# Patient Record
Sex: Male | Born: 1960 | Race: White | Hispanic: No | Marital: Married | Smoking: Never smoker
Health system: Southern US, Community
[De-identification: ages and names within clinical notes are randomized; demographics above are authoritative.]

## PROBLEM LIST (undated history)

## (undated) DIAGNOSIS — R945 Abnormal results of liver function studies: Secondary | ICD-10-CM

## (undated) DIAGNOSIS — G4700 Insomnia, unspecified: Secondary | ICD-10-CM

## (undated) DIAGNOSIS — F419 Anxiety disorder, unspecified: Secondary | ICD-10-CM

## (undated) DIAGNOSIS — N419 Inflammatory disease of prostate, unspecified: Secondary | ICD-10-CM

## (undated) DIAGNOSIS — I10 Essential (primary) hypertension: Secondary | ICD-10-CM

## (undated) DIAGNOSIS — R809 Proteinuria, unspecified: Secondary | ICD-10-CM

## (undated) DIAGNOSIS — N529 Male erectile dysfunction, unspecified: Secondary | ICD-10-CM

## (undated) DIAGNOSIS — R7989 Other specified abnormal findings of blood chemistry: Secondary | ICD-10-CM

## (undated) DIAGNOSIS — E785 Hyperlipidemia, unspecified: Secondary | ICD-10-CM

## (undated) DIAGNOSIS — K635 Polyp of colon: Secondary | ICD-10-CM

## (undated) HISTORY — PX: HEMORRHOID SURGERY: SHX153

---

## 2010-03-07 ENCOUNTER — Ambulatory Visit: Payer: Self-pay | Admitting: General Practice

## 2010-03-13 ENCOUNTER — Ambulatory Visit: Payer: Self-pay | Admitting: General Practice

## 2011-08-15 ENCOUNTER — Emergency Department: Payer: Self-pay | Admitting: Emergency Medicine

## 2011-08-15 LAB — URINALYSIS, COMPLETE
Bacteria: NONE SEEN
Bilirubin,UR: NEGATIVE
Glucose,UR: NEGATIVE mg/dL (ref 0–75)
Nitrite: NEGATIVE
Protein: NEGATIVE
RBC,UR: 1 /HPF (ref 0–5)
Specific Gravity: 1.006 (ref 1.003–1.030)
WBC UR: <1 /HPF (ref 0–5)

## 2011-08-15 LAB — CBC
MCH: 35.1 pg — ABNORMAL HIGH (ref 26.0–34.0)
MCHC: 34.8 g/dL (ref 32.0–36.0)
MCV: 101 fL — ABNORMAL HIGH (ref 80–100)
Platelet: 188 10*3/uL (ref 150–440)
RBC: 4.65 10*6/uL (ref 4.40–5.90)

## 2011-08-15 LAB — COMPREHENSIVE METABOLIC PANEL
Alkaline Phosphatase: 94 U/L (ref 50–136)
Calcium, Total: 8.7 mg/dL (ref 8.5–10.1)
Co2: 25 mmol/L (ref 21–32)
EGFR (African American): >60
EGFR (Non-African Amer.): >60
Osmolality: 277 (ref 275–301)
SGPT (ALT): 93 U/L — ABNORMAL HIGH
Sodium: 139 mmol/L (ref 136–145)

## 2011-08-21 LAB — CULTURE, BLOOD (SINGLE)

## 2012-09-02 ENCOUNTER — Ambulatory Visit: Payer: Self-pay | Admitting: Internal Medicine

## 2012-11-12 ENCOUNTER — Ambulatory Visit: Payer: Self-pay | Admitting: Internal Medicine

## 2013-08-23 HISTORY — PX: COLONOSCOPY: SHX174

## 2018-11-05 ENCOUNTER — Other Ambulatory Visit: Payer: Self-pay

## 2018-11-05 ENCOUNTER — Emergency Department
Admission: EM | Admit: 2018-11-05 | Discharge: 2018-11-05 | Disposition: A | Discharge status: Home / Self Care | Payer: PRIVATE HEALTH INSURANCE | Attending: Emergency Medicine | Admitting: Emergency Medicine

## 2018-11-05 ENCOUNTER — Encounter: Payer: Self-pay | Admitting: Emergency Medicine

## 2018-11-05 DIAGNOSIS — Y92512 Supermarket, store or market as the place of occurrence of the external cause: Secondary | ICD-10-CM | POA: Insufficient documentation

## 2018-11-05 DIAGNOSIS — I10 Essential (primary) hypertension: Secondary | ICD-10-CM | POA: Diagnosis not present

## 2018-11-05 DIAGNOSIS — Y9301 Activity, walking, marching and hiking: Secondary | ICD-10-CM | POA: Insufficient documentation

## 2018-11-05 DIAGNOSIS — S0181XA Laceration without foreign body of other part of head, initial encounter: Secondary | ICD-10-CM

## 2018-11-05 DIAGNOSIS — W01198A Fall on same level from slipping, tripping and stumbling with subsequent striking against other object, initial encounter: Secondary | ICD-10-CM | POA: Diagnosis not present

## 2018-11-05 DIAGNOSIS — Y998 Other external cause status: Secondary | ICD-10-CM | POA: Insufficient documentation

## 2018-11-05 DIAGNOSIS — S0990XA Unspecified injury of head, initial encounter: Secondary | ICD-10-CM

## 2018-11-05 HISTORY — DX: Essential (primary) hypertension: I10

## 2018-11-05 NOTE — ED Provider Notes (Signed)
[facility] Emergency Department Provider Note  ____________________________________________   I have reviewed the triage vital signs and the nursing notes.   HISTORY  Chief Complaint Fall and Laceration   History limited by: Not Limited   HPI Ryan Rivers is a 58 y.o. male who presents to the emergency department today via EMS after suffering a fall and forehead laceration.  The patient states that he was in a grocery store.  He turned around quickly and slipped on the floor.  Try to catch himself with his left hand but then hit his forehead on the ground.  Started bleeding profusely.  He denies any loss of consciousness.  Has some soreness in his left shoulder but no significant pain.  Patient denies any blood thinners.  Denies any blurry vision.  Denies any nausea or vomiting.   Records reviewed. Per medical record review patient has a history of hypertension.  Past Medical History:  Diagnosis Date  . Hypertension     There are no active problems to display for this patient.   History reviewed. No pertinent surgical history.  Prior to Admission medications   Not on File    Allergies Patient has no known allergies.  History reviewed. No pertinent family history.  Social History Social History   Tobacco Use  . Smoking status: Never Smoker  . Smokeless tobacco: Never Used  Substance Use Topics  . Alcohol use: Yes    Comment: couple beers a night  . Drug use: Never    Review of Systems Constitutional: No fever/chills Eyes: No visual changes. ENT: No sore throat. Cardiovascular: Denies chest pain. Respiratory: Denies shortness of breath. Gastrointestinal: No abdominal pain.  No nausea, no vomiting.  No diarrhea.   Genitourinary: Negative for dysuria. Musculoskeletal: Positive for left shoulder pain.  Skin: Positive for laceration to forehead.  Neurological: Negative for headaches, focal weakness or  numbness.  ____________________________________________   PHYSICAL EXAM:  VITAL SIGNS: ED Triage Vitals [11/05/18 1656]  Enc Vitals Group     BP (!) 184/101     Pulse Rate 98     Resp 18     Temp 98.4 F (36.9 C)     Temp Source Oral     SpO2 100 %     Weight 150 lb (68 kg)     Height 5\' 8"  (1.727 m)     Head Circumference      Peak Flow      Pain Score 0     Pain Loc      Pain Edu?      Excl. in [place]?      Constitutional: Alert and oriented.  Eyes: Conjunctivae are normal.  ENT      Head: Normocephalic      Nose: No congestion/rhinnorhea.      Mouth/Throat: Mucous membranes are moist.      Neck: No stridor. Cardiovascular: Normal rate, regular rhythm.  No murmurs, rubs, or gallops.  Respiratory: Normal respiratory effort without tachypnea nor retractions. Breath sounds are clear and equal bilaterally. No wheezes/rales/rhonchi. Gastrointestinal: Soft and non tender. No rebound. No guarding.  Genitourinary: Deferred Musculoskeletal: Left shoulder without deformity. Full ROM. Neurologic:  Normal speech and language. No gross focal neurologic deficits are appreciated.  Skin: Roughly two 1-cm lacerations to left forehead.  Psychiatric: Mood and affect are normal. Speech and behavior are normal. Patient exhibits appropriate insight and judgment.  ____________________________________________    LABS (pertinent positives/negatives)  None  ____________________________________________   EKG  None  ____________________________________________    RADIOLOGY  None  ____________________________________________   PROCEDURES  Procedures  LACERATION REPAIR Performed by: Nance Pear Authorized by: Nance Pear Consent: Verbal consent obtained. Risks and benefits: risks, benefits and alternatives were discussed Consent given by: patient Patient identity confirmed: provided demographic data Prepped and Draped in normal sterile fashion Wound  explored  Laceration Location: left forehead  Laceration Length: two 1 cm lacerations  No Foreign Bodies seen or palpated  Anesthesia: local infiltration  Local anesthetic: lidocaine with epinephrine  Anesthetic total: 2 ml  Irrigation method: syringe Amount of cleaning: standard  Skin closure: 5-0 vicryl rapide  Number of sutures: 5  Technique: simple interrupted  Patient tolerance: Patient tolerated the procedure well with no immediate complications.  ____________________________________________   INITIAL IMPRESSION / ASSESSMENT AND PLAN / ED COURSE  Pertinent labs & imaging results that were available during my care of the patient were reviewed by me and considered in my medical decision making (see chart for details).   Patient presented to the emergency department today after a fall.  Patient suffered two 1 cm lacerations to his left forehead.  No loss of consciousness.  Patient is not on any blood thinners.  Lacerations were repaired.  Full and painless ROM of the left shoulder. Doubt osseous injury or dislocation. Discussed with patient return precautions. ____________________________________________   FINAL CLINICAL IMPRESSION(S) / ED DIAGNOSES  Final diagnoses:  Injury of head, initial encounter  Laceration of forehead, initial encounter     Note: This dictation was prepared with Dragon dictation. Any transcriptional errors that result from this process are unintentional     Nance Pear, MD 11/05/18 2012

## 2018-11-05 NOTE — ED Triage Notes (Signed)
Pt to ED via EMS after fall today, states was at grocery store and slipped on the floor and fell, hitting head on floor, denies LOC or pain.  Pt ambulatory on scene, heavy bleeding at store but controlled with EMS.  EMS states seems arterial.  Presents A&Ox4, chest rise even and unlabored, in NAD at this time.

## 2018-11-05 NOTE — Discharge Instructions (Addendum)
Please seek medical attention for any high fevers, chest pain, shortness of breath, change in behavior, persistent vomiting, bloody stool or any other new or concerning symptoms.  

## 2018-11-24 ENCOUNTER — Other Ambulatory Visit: Payer: Self-pay

## 2018-11-24 ENCOUNTER — Other Ambulatory Visit (HOSPITAL_COMMUNITY): Payer: Self-pay | Admitting: Physician Assistant

## 2018-11-24 ENCOUNTER — Ambulatory Visit
Admission: RE | Admit: 2018-11-24 | Discharge: 2018-11-24 | Disposition: A | Discharge status: Home / Self Care | Payer: PRIVATE HEALTH INSURANCE | Source: Ambulatory Visit | Attending: Physician Assistant | Admitting: Physician Assistant

## 2018-11-24 ENCOUNTER — Other Ambulatory Visit: Payer: Self-pay | Admitting: Physician Assistant

## 2018-11-24 DIAGNOSIS — G44311 Acute post-traumatic headache, intractable: Secondary | ICD-10-CM | POA: Insufficient documentation

## 2018-11-24 DIAGNOSIS — S0990XD Unspecified injury of head, subsequent encounter: Secondary | ICD-10-CM | POA: Diagnosis not present

## 2018-11-24 DIAGNOSIS — R42 Dizziness and giddiness: Secondary | ICD-10-CM | POA: Insufficient documentation

## 2019-02-11 ENCOUNTER — Other Ambulatory Visit
Admission: RE | Admit: 2019-02-11 | Discharge: 2019-02-11 | Disposition: A | Discharge status: Home / Self Care | Payer: PRIVATE HEALTH INSURANCE | Source: Ambulatory Visit | Attending: Internal Medicine | Admitting: Internal Medicine

## 2019-02-11 ENCOUNTER — Other Ambulatory Visit: Payer: Self-pay

## 2019-02-11 DIAGNOSIS — Z86010 Personal history of colonic polyps: Secondary | ICD-10-CM | POA: Diagnosis not present

## 2019-02-11 DIAGNOSIS — Z20828 Contact with and (suspected) exposure to other viral communicable diseases: Secondary | ICD-10-CM | POA: Diagnosis not present

## 2019-02-11 DIAGNOSIS — Z01812 Encounter for preprocedural laboratory examination: Secondary | ICD-10-CM | POA: Diagnosis not present

## 2019-02-11 LAB — SARS CORONAVIRUS 2 (TAT 6-24 HRS): SARS Coronavirus 2: NEGATIVE

## 2019-02-15 ENCOUNTER — Encounter: Payer: Self-pay | Admitting: *Deleted

## 2019-02-16 ENCOUNTER — Ambulatory Visit: Payer: PRIVATE HEALTH INSURANCE | Admitting: Certified Registered Nurse Anesthetist

## 2019-02-16 ENCOUNTER — Encounter: Payer: Self-pay | Admitting: Emergency Medicine

## 2019-02-16 ENCOUNTER — Ambulatory Visit
Admission: RE | Admit: 2019-02-16 | Discharge: 2019-02-16 | Disposition: A | Discharge status: Home / Self Care | Payer: PRIVATE HEALTH INSURANCE | Attending: Internal Medicine | Admitting: Internal Medicine

## 2019-02-16 ENCOUNTER — Encounter
Admission: RE | Disposition: A | Discharge status: Home / Self Care | Payer: Self-pay | Source: Home / Self Care | Attending: Internal Medicine

## 2019-02-16 DIAGNOSIS — K621 Rectal polyp: Secondary | ICD-10-CM | POA: Diagnosis not present

## 2019-02-16 DIAGNOSIS — K640 First degree hemorrhoids: Secondary | ICD-10-CM | POA: Diagnosis not present

## 2019-02-16 DIAGNOSIS — I10 Essential (primary) hypertension: Secondary | ICD-10-CM | POA: Diagnosis not present

## 2019-02-16 DIAGNOSIS — K5730 Diverticulosis of large intestine without perforation or abscess without bleeding: Secondary | ICD-10-CM | POA: Diagnosis not present

## 2019-02-16 DIAGNOSIS — Z1211 Encounter for screening for malignant neoplasm of colon: Secondary | ICD-10-CM | POA: Diagnosis not present

## 2019-02-16 DIAGNOSIS — Z86010 Personal history of colonic polyps: Secondary | ICD-10-CM | POA: Insufficient documentation

## 2019-02-16 HISTORY — DX: Abnormal results of liver function studies: R94.5

## 2019-02-16 HISTORY — PX: COLONOSCOPY WITH PROPOFOL: SHX5780

## 2019-02-16 HISTORY — DX: Male erectile dysfunction, unspecified: N52.9

## 2019-02-16 HISTORY — DX: Other specified abnormal findings of blood chemistry: R79.89

## 2019-02-16 HISTORY — DX: Anxiety disorder, unspecified: F41.9

## 2019-02-16 HISTORY — DX: Polyp of colon: K63.5

## 2019-02-16 HISTORY — DX: Insomnia, unspecified: G47.00

## 2019-02-16 HISTORY — DX: Inflammatory disease of prostate, unspecified: N41.9

## 2019-02-16 HISTORY — DX: Proteinuria, unspecified: R80.9

## 2019-02-16 HISTORY — DX: Hyperlipidemia, unspecified: E78.5

## 2019-02-16 SURGERY — COLONOSCOPY WITH PROPOFOL
Anesthesia: General

## 2019-02-16 MED ORDER — MIDAZOLAM HCL 2 MG/2ML IJ SOLN
INTRAMUSCULAR | Status: AC
  Filled 2019-02-16: qty 2

## 2019-02-16 MED ORDER — PROPOFOL 10 MG/ML IV BOLUS
INTRAVENOUS | Status: AC
  Filled 2019-02-16: qty 40

## 2019-02-16 MED ORDER — PROPOFOL 10 MG/ML IV BOLUS
INTRAVENOUS | Status: DC | PRN
  Administered 2019-02-16: 50 mg via INTRAVENOUS
  Administered 2019-02-16 (×2): 40 mg via INTRAVENOUS
  Administered 2019-02-16 (×2): 50 mg via INTRAVENOUS
  Administered 2019-02-16: 100 mg via INTRAVENOUS

## 2019-02-16 MED ORDER — MIDAZOLAM HCL 2 MG/2ML IJ SOLN
INTRAMUSCULAR | Status: DC | PRN
  Administered 2019-02-16: 2 mg via INTRAVENOUS

## 2019-02-16 MED ORDER — PROPOFOL 500 MG/50ML IV EMUL
INTRAVENOUS | Status: DC | PRN
  Administered 2019-02-16: 120 ug/kg/min via INTRAVENOUS

## 2019-02-16 MED ORDER — SODIUM CHLORIDE 0.9 % IV SOLN
INTRAVENOUS | Status: DC
  Administered 2019-02-16: 1000 mL via INTRAVENOUS

## 2019-02-16 NOTE — Op Note (Signed)
[facility] Gastroenterology Patient Name: Ryan Rivers Procedure Date: 02/16/2019 10:35 AM MRN: [id number] Account #: [id number] Date of Birth: [birth year] Admit Type: Outpatient Age: 58 Room: [facility] ENDO ROOM 3 Gender: Male Note Status: Finalized Procedure:            Colonoscopy Indications:          Surveillance: Personal history of adenomatous polyps on                        last colonoscopy > 3 years ago Providers:            Lorie Apley K. Torre Pikus MD, MD Medicines:            Propofol per Anesthesia Complications:        No immediate complications. Procedure:            Pre-Anesthesia Assessment:                       - The risks and benefits of the procedure and the                        sedation options and risks were discussed with the                        patient. All questions were answered and informed                        consent was obtained.                       - Patient identification and proposed procedure were                        verified prior to the procedure by the nurse. The                        procedure was verified in the procedure room.                       - ASA Grade Assessment: III - A patient with severe                        systemic disease.                       - After reviewing the risks and benefits, the patient                        was deemed in satisfactory condition to undergo the                        procedure.                       After obtaining informed consent, the colonoscope was                        passed under direct vision. Throughout the procedure,                        the patient's blood pressure, pulse, and oxygen  saturations were monitored continuously. The                        Colonoscope was introduced through the anus and                        advanced to the the cecum, identified by appendiceal                        orifice and ileocecal valve. The colonoscopy was                         performed without difficulty. The patient tolerated the                        procedure well. The quality of the bowel preparation                        was good. The ileocecal valve, appendiceal orifice, and                        rectum were photographed. Findings:      The perianal and digital rectal examinations were normal. Pertinent       negatives include normal sphincter tone and no palpable rectal lesions.      Multiple small and large-mouthed diverticula were found in the sigmoid       colon.      Two sessile polyps were found in the rectum. The polyps were 3 to 4 mm       in size. These polyps were removed with a jumbo cold forceps. Resection       and retrieval were complete.      Non-bleeding internal hemorrhoids were found during retroflexion. The       hemorrhoids were Grade I (internal hemorrhoids that do not prolapse).      The exam was otherwise without abnormality on direct and retroflexion       views. Impression:           - Diverticulosis in the sigmoid colon.                       - Two 3 to 4 mm polyps in the rectum, removed with a                        jumbo cold forceps. Resected and retrieved.                       - Non-bleeding internal hemorrhoids.                       - The examination was otherwise normal on direct and                        retroflexion views. Recommendation:       - Patient has a contact number available for                        emergencies. The signs and symptoms of potential                        delayed complications  were discussed with the patient.                        Return to normal activities tomorrow. Written discharge                        instructions were provided to the patient.                       - Resume previous diet.                       - Continue present medications.                       - Await pathology results.                       - Repeat colonoscopy in 5 years for  surveillance.                       - Return to physician assistant in 3 months.                       - The findings and recommendations were discussed with                        the patient. Procedure Code(s):    --- Professional ---                       [phone], Colonoscopy, flexible; with biopsy, single or                        multiple Diagnosis Code(s):    --- Professional ---                       K57.30, Diverticulosis of large intestine without                        perforation or abscess without bleeding                       K62.1, Rectal polyp                       K64.0, First degree hemorrhoids                       Z86.010, Personal history of colonic polyps CPT copyright 2019 [employer]. All rights reserved. The codes documented in this report are preliminary and upon coder review may  be revised to meet current compliance requirements. Efrain Sella MD, MD 02/16/2019 11:20:06 AM This report has been signed electronically. Number of Addenda: 0 Note Initiated On: 02/16/2019 10:35 AM Scope Withdrawal Time: 0 hours 6 minutes 16 seconds  Total Procedure Duration: 0 hours 10 minutes 21 seconds  Estimated Blood Loss: Estimated blood loss: none.      [facility]

## 2019-02-16 NOTE — Anesthesia Postprocedure Evaluation (Signed)
Anesthesia Post Note  Patient: Ryan Rivers  Procedure(s) Performed: COLONOSCOPY WITH PROPOFOL (N/A )  Patient location during evaluation: Endoscopy Anesthesia Type: General Level of consciousness: awake and alert and oriented Pain management: pain level controlled Vital Signs Assessment: post-procedure vital signs reviewed and stable Respiratory status: spontaneous breathing, nonlabored ventilation and respiratory function stable Cardiovascular status: blood pressure returned to baseline and stable Postop Assessment: no signs of nausea or vomiting Anesthetic complications: no     Last Vitals:  Vitals:   02/16/19 0931 02/16/19 1120  BP: (!) 171/97   Pulse: 95   Resp: 17   Temp: (!) 36 C (!) 36 C  SpO2: 99%     Last Pain:  Vitals:   02/16/19 1150  TempSrc:   PainSc: 0-No pain                 Gustavo Meditz

## 2019-02-16 NOTE — H&P (Signed)
Outpatient short stay form Pre-procedure 02/16/2019 9:51 AM Teodoro K. Alice Reichert, M.D.  Primary Physician:John Edwina Barth, M.D.  Reason for visit:  Personal hx of adenomatous colon polyps  History of present illness:                            Patient presents for colonoscopy for a personal hx of colon polyps. The patient denies abdominal pain, abnormal weight loss or rectal bleeding.     Current Facility-Administered Medications:  .  0.9 %  sodium chloride infusion, , Intravenous, Continuous, Henderson, Benay Pike, MD, Last Rate: 20 mL/hr at 02/16/19 0945, 1,000 mL at 02/16/19 0945  Medications Prior to Admission  Medication Sig Dispense Refill Last Dose  . Nebivolol HCl (BYSTOLIC PO) Take 20 mg by mouth daily.   02/15/2019 at Unknown time     Allergies  Allergen Reactions  . Sulfa Antibiotics Other (See Comments)  . Sulfur Other (See Comments)     Past Medical History:  Diagnosis Date  . Abnormal LFTs (liver function tests)   . Anxiety   . Colon polyps   . Erectile dysfunction   . Hyperlipidemia   . Hypertension   . Insomnia   . Microalbuminuria   . Prostatitis     Review of systems:  Otherwise negative.    Physical Exam  Gen: Alert, oriented. Appears stated age.  HEENT: [place]/AT. PERRLA. Lungs: CTA, no wheezes. CV: RR nl S1, S2. Abd: soft, benign, no masses. BS+ Ext: No edema. Pulses 2+    Planned procedures: Proceed with colonoscopy. The patient understands the nature of the planned procedure, indications, risks, alternatives and potential complications including but not limited to bleeding, infection, perforation, damage to internal organs and possible oversedation/side effects from anesthesia. The patient agrees and gives consent to proceed.  Please refer to procedure notes for findings, recommendations and patient disposition/instructions.     Teodoro K. Alice Reichert, M.D. Gastroenterology 02/16/2019  9:51 AM

## 2019-02-16 NOTE — Transfer of Care (Signed)
Immediate Anesthesia Transfer of Care Note  Patient: Ryan Rivers  Procedure(s) Performed: COLONOSCOPY WITH PROPOFOL (N/A )  Patient Location: PACU and Endoscopy Unit  Anesthesia Type:General  Level of Consciousness: awake and patient cooperative  Airway & Oxygen Therapy: Patient Spontanous Breathing  Post-op Assessment: Report given to RN, Post -op Vital signs reviewed and stable and Patient moving all extremities  Post vital signs: Reviewed and stable  Last Vitals:  Vitals Value Taken Time  BP 106/74 02/16/19 1120  Temp    Pulse 90 02/16/19 1120  Resp 18 02/16/19 1120  SpO2 97 % 02/16/19 1120  Vitals shown include unvalidated device data.  Last Pain:  Vitals:   02/16/19 0931  TempSrc: Tympanic  PainSc: 0-No pain         Complications: No apparent anesthesia complications

## 2019-02-16 NOTE — Anesthesia Preprocedure Evaluation (Signed)
Anesthesia Evaluation  Patient identified by MRN, date of birth, ID band Patient awake    Reviewed: Allergy & Precautions, H&P , NPO status , Patient's Chart, lab work & pertinent test results, reviewed documented beta blocker date and time   Airway Mallampati: II   Neck ROM: full    Dental  (+) Poor Dentition   Pulmonary neg pulmonary ROS,    Pulmonary exam normal        Cardiovascular Exercise Tolerance: Good hypertension, On Medications negative cardio ROS Normal cardiovascular exam Rhythm:regular Rate:Normal     Neuro/Psych Anxiety negative neurological ROS  negative psych ROS   GI/Hepatic negative GI ROS, Neg liver ROS,   Endo/Other  negative endocrine ROS  Renal/GU negative Renal ROS  negative genitourinary   Musculoskeletal   Abdominal   Peds  Hematology negative hematology ROS (+)   Anesthesia Other Findings Past Medical History: No date: Abnormal LFTs (liver function tests) No date: Anxiety No date: Colon polyps No date: Erectile dysfunction No date: Hyperlipidemia No date: Hypertension No date: Insomnia No date: Microalbuminuria No date: Prostatitis Past Surgical History: 08/23/2013: COLONOSCOPY No date: HEMORRHOID SURGERY BMI    Body Mass Index: 23.57 kg/m     Reproductive/Obstetrics negative OB ROS                             Anesthesia Physical Anesthesia Plan  ASA: II  Anesthesia Plan: General   Post-op Pain Management:    Induction:   PONV Risk Score and Plan:   Airway Management Planned:   Additional Equipment:   Intra-op Plan:   Post-operative Plan:   Informed Consent: I have reviewed the patients History and Physical, chart, labs and discussed the procedure including the risks, benefits and alternatives for the proposed anesthesia with the patient or authorized representative who has indicated his/her understanding and acceptance.      Dental Advisory Given  Plan Discussed with: CRNA  Anesthesia Plan Comments:         Anesthesia Quick Evaluation

## 2019-02-16 NOTE — Interval H&P Note (Signed)
History and Physical Interval Note:  02/16/2019 9:52 AM  Ryan Rivers  has presented today for surgery, with the diagnosis of PERSONAL HX.OF COLON POLYPS.  The various methods of treatment have been discussed with the patient and family. After consideration of risks, benefits and other options for treatment, the patient has consented to  Procedure(s): COLONOSCOPY WITH PROPOFOL (N/A) as a surgical intervention.  The patient's history has been reviewed, patient examined, no change in status, stable for surgery.  I have reviewed the patient's chart and labs.  Questions were answered to the patient's satisfaction.     Crandall, Byron

## 2019-02-16 NOTE — Anesthesia Post-op Follow-up Note (Signed)
Anesthesia QCDR form completed.        

## 2019-02-17 ENCOUNTER — Encounter: Payer: Self-pay | Admitting: Internal Medicine

## 2019-02-17 LAB — SURGICAL PATHOLOGY

## 2020-02-09 IMAGING — CT CT HEAD WITHOUT CONTRAST
3 series · 16 of 47 positions shown, 19 images · non-contrast
Comparison: None.

CLINICAL DATA: Lightheadedness since a fall and blow to the head 2
weeks ago. Initial encounter.

EXAM:
CT HEAD WITHOUT CONTRAST
TECHNIQUE: Contiguous axial images were obtained from the base of the skull
through the vertex without intravenous contrast.

[Series 2: head wo · axial · 0.42mm/px · z∈[+349,+484]mm · 10 of 33 slices shown, 13 images]
[im 3/33  brain]
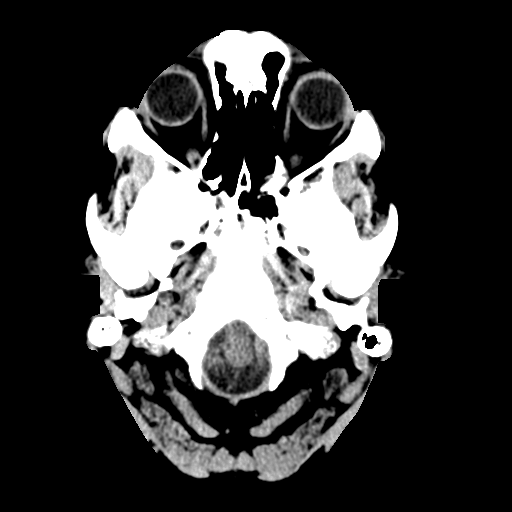
[im 3/33  bone]
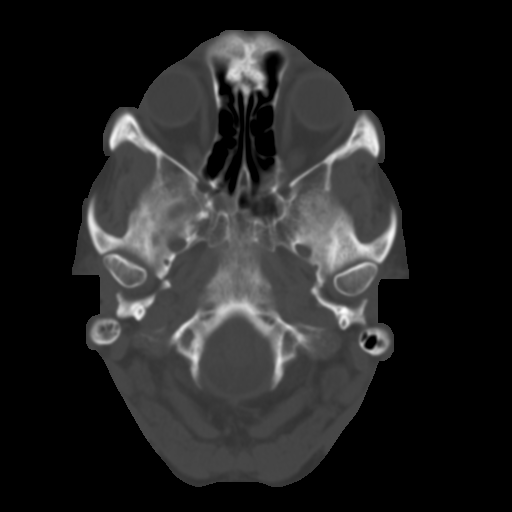
[im 6/33  brain]
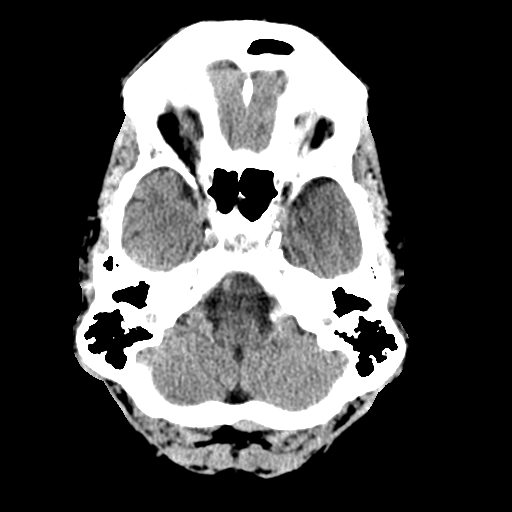
[im 9/33  brain]
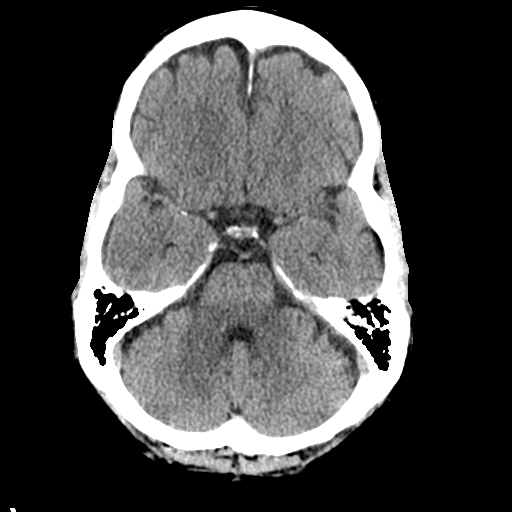
[im 12/33  brain]
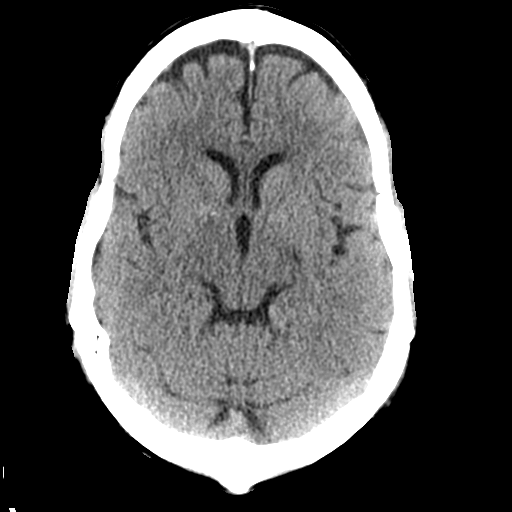
[im 15/33  brain]
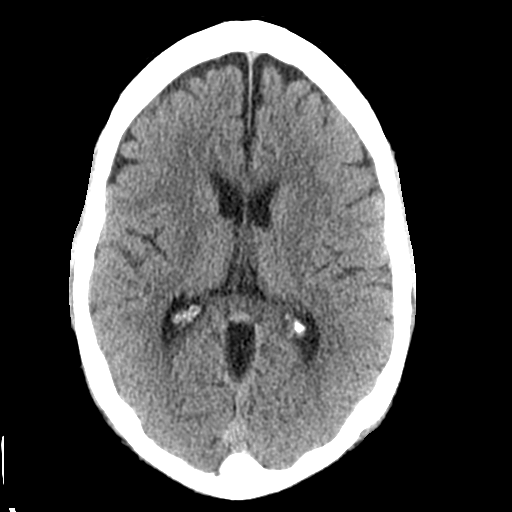
[im 15/33  bone]
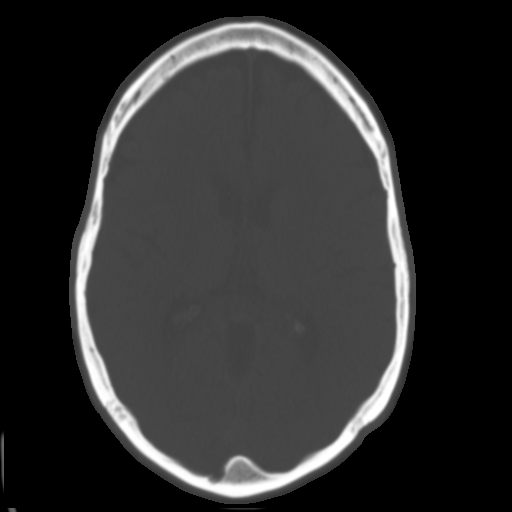
[im 18/33  brain]
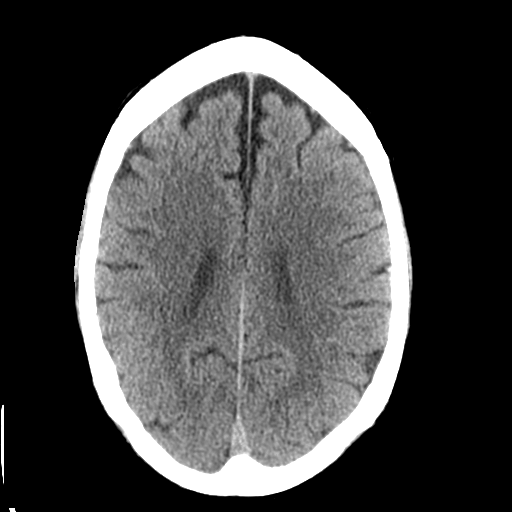
[im 21/33  brain]
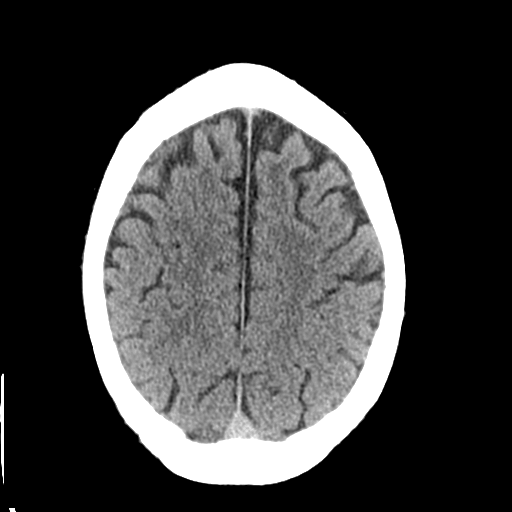
[im 25/33  brain]
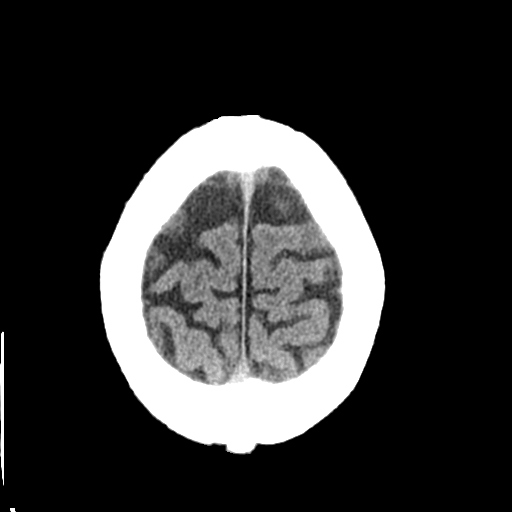
[im 27/33  brain]
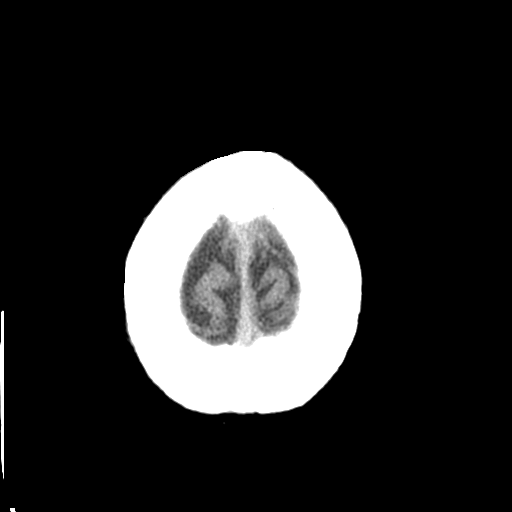
[im 27/33  bone]
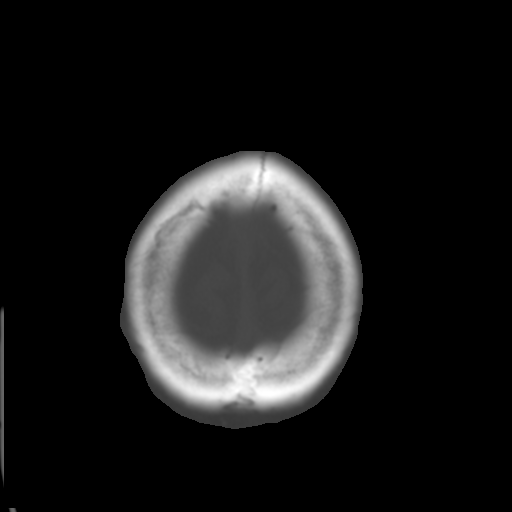
[im 30/33  brain]
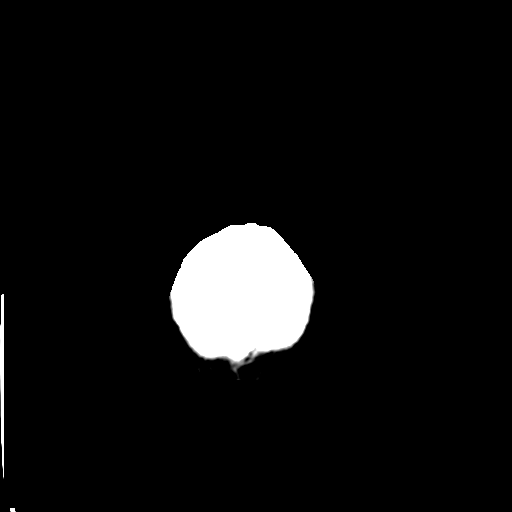

[Series 4: coronal soft tissue · coronal · 0.33mm/px · 3 of 72 slices shown]
[im 24/72  brain]
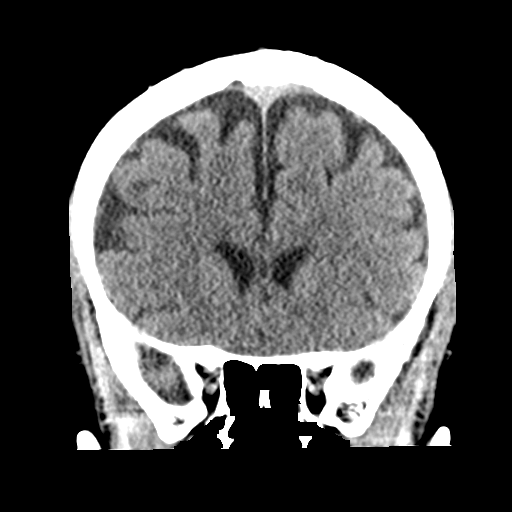
[im 32/72  brain]
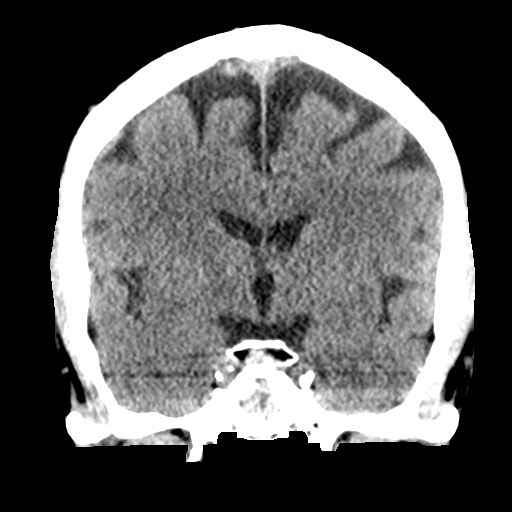
[im 40/72  brain]
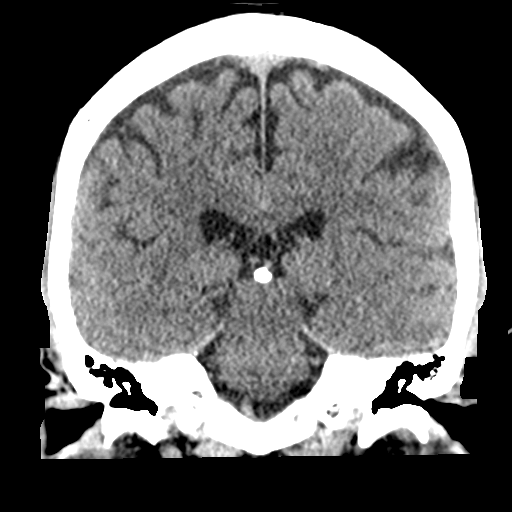

[Series 5: sagittal soft tissue · sagittal · 0.33mm/px · 3 of 59 slices shown]
[im 20/59  brain]
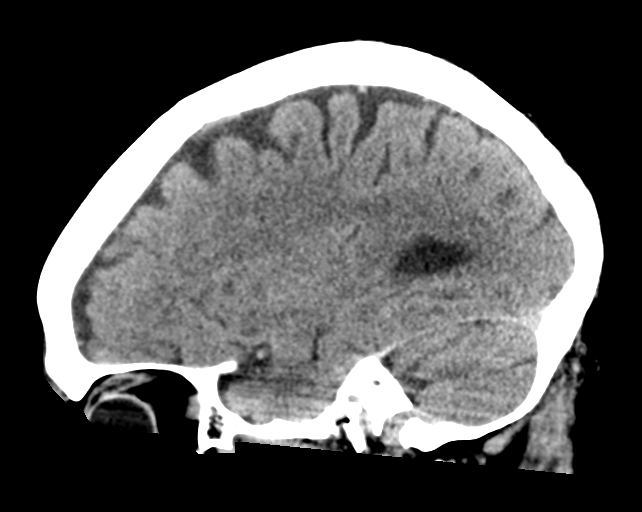
[im 30/59  brain]
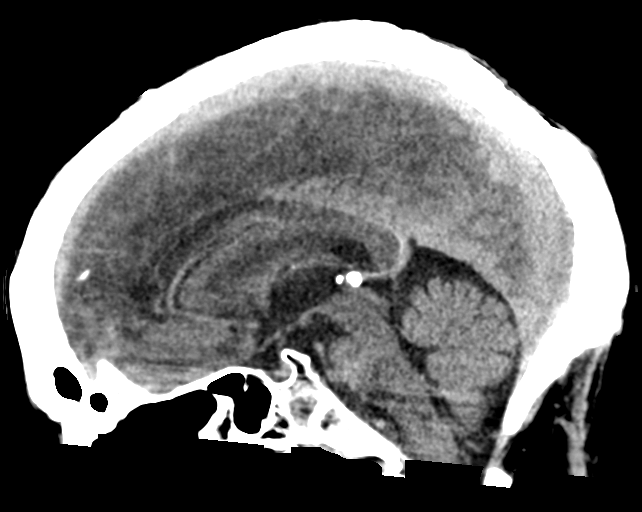
[im 39/59  brain]
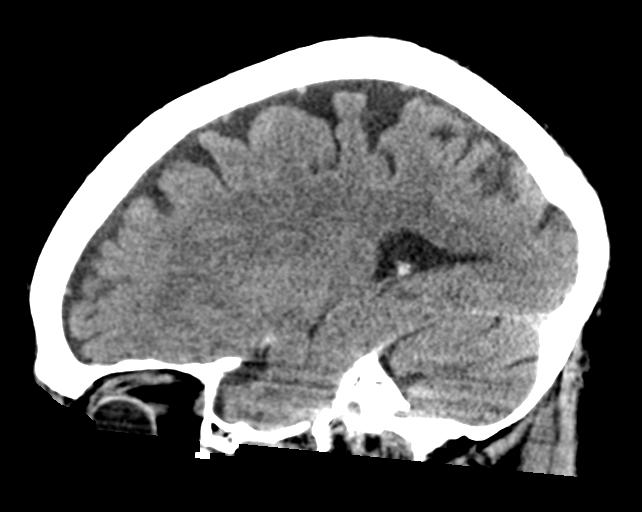

[16 of 47 positions shown; findings below may reference images not displayed]

FINDINGS: Brain: No evidence of acute infarction, hemorrhage, hydrocephalus,
extra-axial collection or mass lesion/mass effect. Mild atrophy
noted.

Vascular: No hyperdense vessel or unexpected calcification.

Skull: Normal. Negative for fracture or focal lesion.

Sinuses/Orbits: Small mucous retention cyst or polyp right sphenoid
sinus is seen. Otherwise negative.

Other: None.
IMPRESSION: No acute abnormality.

Mild cortical atrophy.

## 2020-03-07 ENCOUNTER — Encounter: Payer: PRIVATE HEALTH INSURANCE | Admitting: Dermatology

## 2020-05-28 ENCOUNTER — Other Ambulatory Visit: Payer: Self-pay

## 2020-05-28 ENCOUNTER — Ambulatory Visit (INDEPENDENT_AMBULATORY_CARE_PROVIDER_SITE_OTHER): Payer: 59 | Admitting: Dermatology

## 2020-05-28 DIAGNOSIS — L821 Other seborrheic keratosis: Secondary | ICD-10-CM

## 2020-05-28 DIAGNOSIS — Z1283 Encounter for screening for malignant neoplasm of skin: Secondary | ICD-10-CM | POA: Diagnosis not present

## 2020-05-28 DIAGNOSIS — L814 Other melanin hyperpigmentation: Secondary | ICD-10-CM

## 2020-05-28 DIAGNOSIS — L304 Erythema intertrigo: Secondary | ICD-10-CM

## 2020-05-28 DIAGNOSIS — D229 Melanocytic nevi, unspecified: Secondary | ICD-10-CM

## 2020-05-28 DIAGNOSIS — L578 Other skin changes due to chronic exposure to nonionizing radiation: Secondary | ICD-10-CM

## 2020-05-28 DIAGNOSIS — D1800 Hemangioma unspecified site: Secondary | ICD-10-CM

## 2020-05-28 NOTE — Progress Notes (Signed)
   Follow-Up Visit   Subjective  Ryan Rivers is a 59 y.o. male who presents for the following: tbse (Patient here today for TBSE, patient states he had mole removed on left tear duct 4 months ago and area seems to be doing good. Also has a place on left lower leg that would like checked. States first noticed 6 month ago denies any symptoms. Patient states he has a place left bottom buttock on mole he would like checked. ). The patient presents for Total-Body Skin Exam (TBSE) for skin cancer screening and mole check.  The following portions of the chart were reviewed this encounter and updated as appropriate:   Tobacco  Allergies  Meds  Problems  Med Hx  Surg Hx  Fam Hx     Review of Systems:  No other skin or systemic complaints except as noted in HPI or Assessment and Plan.  Objective  Well appearing patient in no apparent distress; mood and affect are within normal limits.  A full examination was performed including scalp, head, eyes, ears, nose, lips, neck, chest, axillae, abdomen, back, buttocks, bilateral upper extremities, bilateral lower extremities, hands, feet, fingers, toes, fingernails, and toenails. All findings within normal limits unless otherwise noted below.  Objective  bilateral inbetween toes: Erythema and macerations.    Assessment & Plan  Erythema intertrigo bilateral inbetween toes [facility] for intertrigo  use daily as needed for use in between toes.   Recommend zeasorb af powder between toes for maintenance.    Intertrigo is a chronic recurrent rash that occurs in skin fold areas that may be associated with friction; heat; moisture; yeast; fungus; and bacteria.  It is exacerbated by increased movement / activity; sweating; and higher atmospheric temperature.  Lentigines - Scattered tan macules - Discussed due to sun exposure - Benign, observe - Call for any changes  Seborrheic Keratoses - Stuck-on, waxy, tan-brown papules and plaques  -  Discussed benign etiology and prognosis. - Observe - Call for any changes  Melanocytic Nevi - Tan-brown and/or pink-flesh-colored symmetric macules and papules - Benign appearing on exam today - Observation - Call clinic for new or changing moles - Recommend daily use of broad spectrum spf 30+ sunscreen to sun-exposed areas.   Hemangiomas - Red papules - Discussed benign nature - Observe - Call for any changes  Actinic Damage - Chronic, secondary to cumulative UV/sun exposure - diffuse scaly erythematous macules with underlying dyspigmentation - Recommend daily broad spectrum sunscreen SPF 30+ to sun-exposed areas, reapply every 2 hours as needed.  - Call for new or changing lesions.  Skin cancer screening performed today.  Return in about 1 year (around 05/28/2021) for TBSE.   IRuthell Rummage, CMA, am acting as scribe for Sarina Ser, MD.  Documentation: I have reviewed the above documentation for accuracy and completeness, and I agree with the above.  Sarina Ser, MD

## 2020-05-28 NOTE — Patient Instructions (Signed)
Instructions for [facility] Medications  One or more of your medications was sent to the [facility] mail order compounding pharmacy. You will receive an email from them and can purchase the medicine through that link. It will then be mailed to your home at the address you confirmed. If for any reason you do not receive an email from them, please check your spam folder. If you still do not find the email, please let us know. [facility] phone number is [phone].  Recommend zeasorb af powder between toes for maintenance.

## 2020-05-30 ENCOUNTER — Encounter: Payer: Self-pay | Admitting: Dermatology

## 2021-05-20 ENCOUNTER — Ambulatory Visit: Payer: 59 | Admitting: Dermatology

## 2021-09-03 DIAGNOSIS — N451 Epididymitis: Secondary | ICD-10-CM | POA: Diagnosis not present

## 2021-12-06 DIAGNOSIS — R399 Unspecified symptoms and signs involving the genitourinary system: Secondary | ICD-10-CM | POA: Diagnosis not present

## 2021-12-11 DIAGNOSIS — R1030 Lower abdominal pain, unspecified: Secondary | ICD-10-CM | POA: Diagnosis not present

## 2022-02-17 DIAGNOSIS — F1210 Cannabis abuse, uncomplicated: Secondary | ICD-10-CM | POA: Diagnosis not present

## 2022-02-17 DIAGNOSIS — I10 Essential (primary) hypertension: Secondary | ICD-10-CM | POA: Diagnosis not present

## 2022-02-17 DIAGNOSIS — G4700 Insomnia, unspecified: Secondary | ICD-10-CM | POA: Diagnosis not present

## 2022-02-17 DIAGNOSIS — Z8616 Personal history of COVID-19: Secondary | ICD-10-CM | POA: Diagnosis not present

## 2022-02-17 DIAGNOSIS — R69 Illness, unspecified: Secondary | ICD-10-CM | POA: Diagnosis not present

## 2022-02-17 DIAGNOSIS — G621 Alcoholic polyneuropathy: Secondary | ICD-10-CM | POA: Diagnosis not present

## 2022-02-17 DIAGNOSIS — F1020 Alcohol dependence, uncomplicated: Secondary | ICD-10-CM | POA: Diagnosis not present

## 2022-02-28 DIAGNOSIS — Z125 Encounter for screening for malignant neoplasm of prostate: Secondary | ICD-10-CM | POA: Diagnosis not present

## 2022-02-28 DIAGNOSIS — Z0000 Encounter for general adult medical examination without abnormal findings: Secondary | ICD-10-CM | POA: Diagnosis not present

## 2022-03-03 DIAGNOSIS — I10 Essential (primary) hypertension: Secondary | ICD-10-CM | POA: Diagnosis not present

## 2022-03-03 DIAGNOSIS — R7301 Impaired fasting glucose: Secondary | ICD-10-CM | POA: Diagnosis not present

## 2022-03-03 DIAGNOSIS — R69 Illness, unspecified: Secondary | ICD-10-CM | POA: Diagnosis not present

## 2022-03-03 DIAGNOSIS — E785 Hyperlipidemia, unspecified: Secondary | ICD-10-CM | POA: Diagnosis not present

## 2022-03-03 DIAGNOSIS — F419 Anxiety disorder, unspecified: Secondary | ICD-10-CM | POA: Diagnosis not present

## 2022-03-03 DIAGNOSIS — Z0001 Encounter for general adult medical examination with abnormal findings: Secondary | ICD-10-CM | POA: Diagnosis not present

## 2022-03-12 DIAGNOSIS — M9902 Segmental and somatic dysfunction of thoracic region: Secondary | ICD-10-CM | POA: Diagnosis not present

## 2022-03-12 DIAGNOSIS — M9903 Segmental and somatic dysfunction of lumbar region: Secondary | ICD-10-CM | POA: Diagnosis not present

## 2022-03-12 DIAGNOSIS — M9904 Segmental and somatic dysfunction of sacral region: Secondary | ICD-10-CM | POA: Diagnosis not present

## 2022-03-12 DIAGNOSIS — M9906 Segmental and somatic dysfunction of lower extremity: Secondary | ICD-10-CM | POA: Diagnosis not present

## 2022-03-12 DIAGNOSIS — M9901 Segmental and somatic dysfunction of cervical region: Secondary | ICD-10-CM | POA: Diagnosis not present

## 2022-03-19 DIAGNOSIS — M9903 Segmental and somatic dysfunction of lumbar region: Secondary | ICD-10-CM | POA: Diagnosis not present

## 2022-03-19 DIAGNOSIS — M9906 Segmental and somatic dysfunction of lower extremity: Secondary | ICD-10-CM | POA: Diagnosis not present

## 2022-03-19 DIAGNOSIS — M9902 Segmental and somatic dysfunction of thoracic region: Secondary | ICD-10-CM | POA: Diagnosis not present

## 2022-03-19 DIAGNOSIS — M9901 Segmental and somatic dysfunction of cervical region: Secondary | ICD-10-CM | POA: Diagnosis not present

## 2022-03-19 DIAGNOSIS — M9904 Segmental and somatic dysfunction of sacral region: Secondary | ICD-10-CM | POA: Diagnosis not present

## 2022-04-24 DIAGNOSIS — D2262 Melanocytic nevi of left upper limb, including shoulder: Secondary | ICD-10-CM | POA: Diagnosis not present

## 2022-04-24 DIAGNOSIS — D2271 Melanocytic nevi of right lower limb, including hip: Secondary | ICD-10-CM | POA: Diagnosis not present

## 2022-04-24 DIAGNOSIS — L821 Other seborrheic keratosis: Secondary | ICD-10-CM | POA: Diagnosis not present

## 2022-04-24 DIAGNOSIS — D2272 Melanocytic nevi of left lower limb, including hip: Secondary | ICD-10-CM | POA: Diagnosis not present

## 2022-04-24 DIAGNOSIS — D225 Melanocytic nevi of trunk: Secondary | ICD-10-CM | POA: Diagnosis not present

## 2022-04-24 DIAGNOSIS — D2261 Melanocytic nevi of right upper limb, including shoulder: Secondary | ICD-10-CM | POA: Diagnosis not present

## 2022-04-24 DIAGNOSIS — L578 Other skin changes due to chronic exposure to nonionizing radiation: Secondary | ICD-10-CM | POA: Diagnosis not present

## 2022-05-21 DIAGNOSIS — R69 Illness, unspecified: Secondary | ICD-10-CM | POA: Diagnosis not present

## 2022-06-14 DIAGNOSIS — J029 Acute pharyngitis, unspecified: Secondary | ICD-10-CM | POA: Diagnosis not present

## 2022-06-14 DIAGNOSIS — Z03818 Encounter for observation for suspected exposure to other biological agents ruled out: Secondary | ICD-10-CM | POA: Diagnosis not present

## 2022-06-17 DIAGNOSIS — J039 Acute tonsillitis, unspecified: Secondary | ICD-10-CM | POA: Diagnosis not present

## 2022-06-24 DIAGNOSIS — E785 Hyperlipidemia, unspecified: Secondary | ICD-10-CM | POA: Diagnosis not present

## 2022-06-24 DIAGNOSIS — Z202 Contact with and (suspected) exposure to infections with a predominantly sexual mode of transmission: Secondary | ICD-10-CM | POA: Diagnosis not present

## 2022-06-24 DIAGNOSIS — I10 Essential (primary) hypertension: Secondary | ICD-10-CM | POA: Diagnosis not present

## 2022-06-24 DIAGNOSIS — Z125 Encounter for screening for malignant neoplasm of prostate: Secondary | ICD-10-CM | POA: Diagnosis not present

## 2022-06-24 DIAGNOSIS — R7301 Impaired fasting glucose: Secondary | ICD-10-CM | POA: Diagnosis not present

## 2022-06-24 DIAGNOSIS — R69 Illness, unspecified: Secondary | ICD-10-CM | POA: Diagnosis not present

## 2022-08-28 DIAGNOSIS — M9902 Segmental and somatic dysfunction of thoracic region: Secondary | ICD-10-CM | POA: Diagnosis not present

## 2022-08-28 DIAGNOSIS — M5413 Radiculopathy, cervicothoracic region: Secondary | ICD-10-CM | POA: Diagnosis not present

## 2022-08-28 DIAGNOSIS — M9904 Segmental and somatic dysfunction of sacral region: Secondary | ICD-10-CM | POA: Diagnosis not present

## 2022-08-28 DIAGNOSIS — M9903 Segmental and somatic dysfunction of lumbar region: Secondary | ICD-10-CM | POA: Diagnosis not present

## 2022-08-28 DIAGNOSIS — M9905 Segmental and somatic dysfunction of pelvic region: Secondary | ICD-10-CM | POA: Diagnosis not present

## 2022-08-28 DIAGNOSIS — M9901 Segmental and somatic dysfunction of cervical region: Secondary | ICD-10-CM | POA: Diagnosis not present

## 2022-09-15 DIAGNOSIS — M25432 Effusion, left wrist: Secondary | ICD-10-CM | POA: Diagnosis not present

## 2022-09-15 DIAGNOSIS — M13832 Other specified arthritis, left wrist: Secondary | ICD-10-CM | POA: Diagnosis not present

## 2022-09-15 DIAGNOSIS — M25532 Pain in left wrist: Secondary | ICD-10-CM | POA: Diagnosis not present

## 2022-09-25 DIAGNOSIS — S63592A Other specified sprain of left wrist, initial encounter: Secondary | ICD-10-CM | POA: Diagnosis not present

## 2022-10-16 DIAGNOSIS — S63592A Other specified sprain of left wrist, initial encounter: Secondary | ICD-10-CM | POA: Diagnosis not present

## 2023-02-26 DIAGNOSIS — Z0000 Encounter for general adult medical examination without abnormal findings: Secondary | ICD-10-CM | POA: Diagnosis not present

## 2023-02-26 DIAGNOSIS — Z125 Encounter for screening for malignant neoplasm of prostate: Secondary | ICD-10-CM | POA: Diagnosis not present

## 2023-04-30 DIAGNOSIS — D2262 Melanocytic nevi of left upper limb, including shoulder: Secondary | ICD-10-CM | POA: Diagnosis not present

## 2023-04-30 DIAGNOSIS — D2271 Melanocytic nevi of right lower limb, including hip: Secondary | ICD-10-CM | POA: Diagnosis not present

## 2023-04-30 DIAGNOSIS — L821 Other seborrheic keratosis: Secondary | ICD-10-CM | POA: Diagnosis not present

## 2023-04-30 DIAGNOSIS — L578 Other skin changes due to chronic exposure to nonionizing radiation: Secondary | ICD-10-CM | POA: Diagnosis not present

## 2023-04-30 DIAGNOSIS — D2272 Melanocytic nevi of left lower limb, including hip: Secondary | ICD-10-CM | POA: Diagnosis not present

## 2023-04-30 DIAGNOSIS — D225 Melanocytic nevi of trunk: Secondary | ICD-10-CM | POA: Diagnosis not present

## 2023-04-30 DIAGNOSIS — D2261 Melanocytic nevi of right upper limb, including shoulder: Secondary | ICD-10-CM | POA: Diagnosis not present

## 2023-07-09 DIAGNOSIS — M9904 Segmental and somatic dysfunction of sacral region: Secondary | ICD-10-CM | POA: Diagnosis not present

## 2023-07-09 DIAGNOSIS — M9905 Segmental and somatic dysfunction of pelvic region: Secondary | ICD-10-CM | POA: Diagnosis not present

## 2023-07-09 DIAGNOSIS — M9903 Segmental and somatic dysfunction of lumbar region: Secondary | ICD-10-CM | POA: Diagnosis not present

## 2023-07-09 DIAGNOSIS — M5413 Radiculopathy, cervicothoracic region: Secondary | ICD-10-CM | POA: Diagnosis not present

## 2023-07-09 DIAGNOSIS — M9902 Segmental and somatic dysfunction of thoracic region: Secondary | ICD-10-CM | POA: Diagnosis not present

## 2023-07-09 DIAGNOSIS — M9901 Segmental and somatic dysfunction of cervical region: Secondary | ICD-10-CM | POA: Diagnosis not present

## 2024-01-17 ENCOUNTER — Other Ambulatory Visit: Payer: Self-pay

## 2024-01-17 ENCOUNTER — Emergency Department
Admission: EM | Admit: 2024-01-17 | Discharge: 2024-01-17 | Disposition: A | Discharge status: Home / Self Care | Attending: Emergency Medicine | Admitting: Emergency Medicine

## 2024-01-17 DIAGNOSIS — I10 Essential (primary) hypertension: Secondary | ICD-10-CM | POA: Diagnosis not present

## 2024-01-17 DIAGNOSIS — R945 Abnormal results of liver function studies: Secondary | ICD-10-CM | POA: Diagnosis not present

## 2024-01-17 DIAGNOSIS — F1090 Alcohol use, unspecified, uncomplicated: Secondary | ICD-10-CM | POA: Diagnosis present

## 2024-01-17 DIAGNOSIS — Z79899 Other long term (current) drug therapy: Secondary | ICD-10-CM | POA: Insufficient documentation

## 2024-01-17 DIAGNOSIS — Y908 Blood alcohol level of 240 mg/100 ml or more: Secondary | ICD-10-CM | POA: Diagnosis not present

## 2024-01-17 DIAGNOSIS — R7989 Other specified abnormal findings of blood chemistry: Secondary | ICD-10-CM

## 2024-01-17 LAB — COMPREHENSIVE METABOLIC PANEL WITH GFR
ALT: 57 U/L — ABNORMAL HIGH (ref 0–44)
AST: 95 U/L — ABNORMAL HIGH (ref 15–41)
Albumin: 4 g/dL (ref 3.5–5.0)
Alkaline Phosphatase: 56 U/L (ref 38–126)
Anion gap: 13 (ref 5–15)
BUN: 6 mg/dL — ABNORMAL LOW (ref 8–23)
CO2: 25 mmol/L (ref 22–32)
Calcium: 8.4 mg/dL — ABNORMAL LOW (ref 8.9–10.3)
Chloride: 100 mmol/L (ref 98–111)
Creatinine, Ser: 0.72 mg/dL (ref 0.61–1.24)
GFR, Estimated: >60 mL/min (ref >60)
Glucose, Bld: 132 mg/dL — ABNORMAL HIGH (ref 70–99)
Potassium: 3.6 mmol/L (ref 3.5–5.1)
Sodium: 138 mmol/L (ref 135–145)
Total Bilirubin: 0.8 mg/dL (ref 0.0–1.2)
Total Protein: 7.6 g/dL (ref 6.5–8.1)

## 2024-01-17 LAB — URINE DRUG SCREEN, QUALITATIVE (ARMC ONLY)
Amphetamines, Ur Screen: NOT DETECTED
Barbiturates, Ur Screen: NOT DETECTED
Benzodiazepine, Ur Scrn: NOT DETECTED
Cannabinoid 50 Ng, Ur ~~LOC~~: POSITIVE — AB
Cocaine Metabolite,Ur ~~LOC~~: NOT DETECTED
MDMA (Ecstasy)Ur Screen: NOT DETECTED
Methadone Scn, Ur: NOT DETECTED
Opiate, Ur Screen: NOT DETECTED
Phencyclidine (PCP) Ur S: NOT DETECTED
Tricyclic, Ur Screen: NOT DETECTED

## 2024-01-17 LAB — CBC
HCT: 47.4 % (ref 39.0–52.0)
Hemoglobin: 16.1 g/dL (ref 13.0–17.0)
MCH: 35.2 pg — ABNORMAL HIGH (ref 26.0–34.0)
MCHC: 34 g/dL (ref 30.0–36.0)
MCV: 103.7 fL — ABNORMAL HIGH (ref 80.0–100.0)
Platelets: 127 K/uL — ABNORMAL LOW (ref 150–400)
RBC: 4.57 MIL/uL (ref 4.22–5.81)
RDW: 12 % (ref 11.5–15.5)
WBC: 4.7 K/uL (ref 4.0–10.5)
nRBC: 0 % (ref 0.0–0.2)

## 2024-01-17 LAB — ETHANOL: Alcohol, Ethyl (B): 421 mg/dL (ref <15)

## 2024-01-17 NOTE — ED Provider Notes (Signed)
 Ryan Rivers Provider Note    Event Date/Time   First MD Initiated Contact with Patient 01/17/24 1949     (approximate)   History   Alcohol  Problem   HPI  Ryan Rivers is a 63 y.o. male with history of anxiety, alcohol  use, hypertension, hyperlipidemia, presenting for alcohol  use.  Was brought in by mom and family friend because they felt that he was drinking too much.  He has not expressed any suicidal ideations or thoughts about hurting himself to him or to his mom.  He denies history of withdrawal, states he last drank alcohol  2 hours ago.  States that he drinks 6 beers in a day.  States that he is not feeling down because of issues with his girlfriend.  He denies any other symptoms, no abdominal pain, no nausea vomiting diarrhea, no chest pain or shortness of breath, no fever or weakness.  No infectious symptoms.  Independent history obtained from mom, she states that he has not expressed suicidal ideations or thoughts about hurting himself to her.  On independent review, view, he was seen as primary care doctor in October 2024, has history of hypertension, was continued on his Bystolic.     Physical Exam   Triage Vital Signs: ED Triage Vitals [01/17/24 1935]  Encounter Vitals Group     BP (!) 142/90     Girls Systolic BP Percentile      Girls Diastolic BP Percentile      Boys Systolic BP Percentile      Boys Diastolic BP Percentile      Pulse Rate 73     Resp 16     Temp 97.6 F (36.4 C)     Temp Source Oral     SpO2 98 %     Weight      Height 5' 8 (1.727 m)     Head Circumference      Peak Flow      Pain Score 0     Pain Loc      Pain Education      Exclude from Growth Chart     Most recent vital signs: Vitals:   01/17/24 1935  BP: (!) 142/90  Pulse: 73  Resp: 16  Temp: 97.6 F (36.4 C)  SpO2: 98%     General: Awake, no distress.  CV:  Good peripheral perfusion.  Resp:  Normal effort.  No tachypnea or respiratory  distress Abd:  No distention.  Soft nontender Other:  No external signs of trauma, no tongue fasciculations or tremors   ED Results / Procedures / Treatments   Labs (all labs ordered are listed, but only abnormal results are displayed) Labs Reviewed  COMPREHENSIVE METABOLIC PANEL WITH GFR - Abnormal; Notable for the following components:      Result Value   Glucose, Bld 132 (*)    BUN 6 (*)    Calcium 8.4 (*)    AST 95 (*)    ALT 57 (*)    All other components within normal limits  ETHANOL - Abnormal; Notable for the following components:   Alcohol , Ethyl (B) 421 (*)    All other components within normal limits  CBC - Abnormal; Notable for the following components:   MCV 103.7 (*)    MCH 35.2 (*)    Platelets 127 (*)    All other components within normal limits  URINE DRUG SCREEN, QUALITATIVE ([facility] ONLY) - Abnormal; Notable for the following components:  Cannabinoid 50 Ng, Ur [place] POSITIVE (*)    All other components within normal limits      PROCEDURES:  Critical Care performed: No  Procedures   MEDICATIONS ORDERED IN ED: Medications - No data to display   IMPRESSION / MDM / ASSESSMENT AND PLAN / ED COURSE  I reviewed the triage vital signs and the nursing notes.                              Differential diagnosis includes, but is not limited to, alcohol  use, electrolyte derangements, no evidence of SI.  Patient is not actively withdrawing at this time.  He is calm and appears clinically sober.  No external traumas or trauma.  Labs, ethanol level was obtained out of triage.  Patient's presentation is most consistent with acute presentation with potential threat to life or bodily function.  Independent interpretation of labs and imaging below.  Will give him something to eat.  Will plan to ambulate him.  Patient was observed in the emergency department, ambulating, tolerating p.o.  Clinically sober at this time.  He is with his friend who is able to take him home.   Did give him a number to call for [facility] in case he wanted to follow-up for alcohol  detox.  With him about lab results including incidental findings, he has a primary care doctor he can follow-up with and get repeat liver function testing.  Otherwise considered but no indication for inpatient admission at this time, he safe for outpatient management.  Will discharge with strict return precautions.  Shared decision making by patient and he is agreeable with this plan.    Clinical Course as of 01/17/24 2308  [place] Jan 17, 2024  2018 Independent review of labs, no leukocytosis, electrolytes not severely deranged, LFTs are normal, AST and ALT are mildly elevated these are improving compared to prior labs.  Suspect this might be related to his alcohol  use.  He has no jaundice, elevated bilirubin or right upper quadrant abdominal pain to suggest biliary etiology. [TT]  2048 Alcohol , Ethyl (B)(!!): 421 Elevated.  Will observe him in the emergency department pending metabolization of the alcohol . [TT]  2118 Urine Drug Screen, Qualitative(!) Positive for cannabinoids. [TT]    Clinical Course User Index [TT] Way[place], Lorelle Cummins, MD     FINAL CLINICAL IMPRESSION(S) / ED DIAGNOSES   Final diagnoses:  Alcohol  use  Elevated LFTs     Rx / DC Orders   ED Discharge Orders     None        Note:  This document was prepared using Dragon voice recognition software and may include unintentional dictation errors.    Waymond Lorelle Cummins, MD 01/17/24 [phone]

## 2024-01-17 NOTE — ED Triage Notes (Addendum)
 Pt to ed from home via POV with mother for they think I have been drinking too much. Pt admits to drinking between 6-12 beers a day. Pt states they think I need to be here. Pt is caox4, in no acute distress in triage. Pt denies SI in triage. 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

## 2024-01-17 NOTE — Discharge Instructions (Addendum)
 Please make sure to follow-up with [facility] if you are interested in alcohol  detoxification.  Patient to follow-up with your primary care doctor to get repeat liver function testing in a week.

## 2024-01-17 NOTE — ED Notes (Signed)
 Patient smells strongly of alcohol , states he has not been eating much and has been drinking way to much to deal with his emotional problems, a/o x 4

## 2024-01-17 NOTE — ED Notes (Signed)
 Patient a/o x 4, able to carry on completely normal conversation, denies SI/HI/AVH

## 2024-01-18 NOTE — Progress Notes (Signed)
 History of Present Illness:   Ryan Rivers is a 63 y.o. male here for   Verbally consented to the use of AI for note-taking.   Chief Complaint  Patient presents with  . level being to high      History of Present Illness Ryan Rivers is a 63 year old male who presents with elevated liver enzymes and alcohol  use.  He was seen in the emergency room yesterday where elevated liver enzymes were noted, with ALT elevated by about 50 points and AST by about 13 points over the normal range.  He consumes between six to eight alcoholic drinks daily, with occasional intake exceeding twelve drinks in one day.  No pain, stomach upset, or other symptoms when questioned directly. He is not currently taking medications like Tylenol  or ibuprofen that could affect liver enzymes.   Past Medical History:   Past Medical History:  Diagnosis Date  . Abnormal LFTs 2014   Had liver USS at at that time and Hepatitis testing  . Anxiety   . Colon polyps 04/2009  . ED (erectile dysfunction)   . Hx of prostatitis   . Hyperlipidemia   . Hypertension   . Insomnia ED  . Microalbuminuria 09/2011   urine MA 48 09/2011    Past Surgical History:   Past Surgical History:  Procedure Laterality Date  . COLONOSCOPY  02/16/2019   Hyperplastic colon polyp/PHx CP/Repeat 53yrs/[facility]  . COLONOSCOPY  08/23/2013, 03/20/2010   Dr. SHAUNNA. Oh @ [facility] - PHPolyps, rpt 5 yrs  . HEMORRHOIDECTOMY INTERNAL & EXTERNAL     Dr Claudene    Allergies:   Allergies  Allergen Reactions  . Diuril [Chlorothiazide] Other (See Comments)    dysuria  . Sulfur (Not Sulfa) Unknown    Unknown reaction  . Buspirone (Bulk) Other (See Comments)    Ankle swelling  . Doxazosin Other (See Comments)    Aggression  . Hydralazine Other (See Comments)    tachycardia  . Lisinopril Diarrhea  . Losartan Other (See Comments)    tremor  . Sulfa (Sulfonamide Antibiotics) Unknown    Current Medications:   Prior to Admission  medications  Medication Sig Taking? Last Dose  BYSTOLIC  20 mg tablet TAKE 1 TABLET BY MOUTH DAILY Yes Taking  tadalafiL (CIALIS) 20 MG tablet TAKE ONE TABLET BY MOUTH ONE TIME DAILY AS NEEDED FOR ERECTILE DYSFUNCTION Yes Taking    Family History:   Family History  Problem Relation Name Age of Onset  . High blood pressure (Hypertension) Father Darrell   . Coronary Artery Disease (Blocked arteries around heart) Father Darrell 68  . High blood pressure (Hypertension) Paternal Jeannine Bucks   . Glaucoma Neg Hx    . Macular degeneration Neg Hx      Social History:   Social History   Socioeconomic History  . Marital status: Divorced  . Number of children: 2  Occupational History  . 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 Smoking status: Never    Passive exposure: Never  . Smokeless tobacco: Never  Vaping Use  . Vaping status: Never Used  Substance and Sexual Activity  . Alcohol  use: Yes    Alcohol /week: 8.0 standard drinks of alcohol     Types: 8 Cans of beer per week    Comment: moderate  . Drug use: No  . Sexual activity: Not Currently    Partners: Female    Birth control/protection: None  Social History Narrative   Married, has a  daughter and son   Runs a machine shop   Tobacco none   Etoh 1-2 beers daily   Hobbies- working fishing   Social Drivers of Health   Housing Stability: Unknown (01/18/2024)   Housing Stability Vital Sign   . Homeless in the Last Year: No    Review of Systems:   A 10 point review of systems is negative, except for the pertinent positives and negatives detailed in the HPI.  Vitals:   Vitals:   01/18/24 1421  BP: 130/76  Pulse: 94  SpO2: 94%  Weight: 70.8 kg (156 lb)     Body mass index is 23.72 kg/m.  Physical Exam:   Physical Exam Vitals and nursing note reviewed.  Constitutional:      General: He is not in acute distress.    Appearance: Normal appearance. He is not ill-appearing, toxic-appearing or  diaphoretic.  HENT:     Head: Normocephalic and atraumatic.     Right Ear: External ear normal.     Left Ear: External ear normal.  Eyes:     Conjunctiva/sclera: Conjunctivae normal.  Cardiovascular:     Rate and Rhythm: Normal rate and regular rhythm.     Pulses: Normal pulses.     Heart sounds: Normal heart sounds.  Pulmonary:     Effort: Pulmonary effort is normal.     Breath sounds: Normal breath sounds.  Abdominal:     General: Bowel sounds are normal.     Palpations: There is mass.     Tenderness: There is abdominal tenderness.  Neurological:     Mental Status: He is alert.     Assessment and Plan:  No results found for this visit on 01/18/24.   Assessment & Plan Alcohol  use disorder Chronic alcohol  use disorder with consumption exceeding 12 drinks daily. Blood alcohol  level of 421 mg/dL, significantly elevated. Alcohol  use causing liver damage and potentially affecting other organs. Behavioral health intervention necessary, contingent on patient willingness. Insurance preauthorization required for behavioral health services, expected within seven days. [facility] identified for assessment and potential treatment. - Initiate prior authorization for behavioral health services. - Refer to [facility] for behavioral health assessment and potential treatment, including outpatient or inpatient care. - Ensure insurance coverage for behavioral health services. - Encourage reduction in alcohol  consumption to prevent further liver damage and other health issues. - Ethanol levels elevated at 421 in the ER yesterday. - Advised to continue some alcohol  consumption to help prevent DT  Elevated liver enzymes Elevated ALT and AST levels, likely secondary to chronic alcohol  use. No current use of Tylenol  or ibuprofen. - Order repeat liver enzyme tests in one week to monitor trends. - Advise against alcohol  consumption to allow liver enzymes to normalize. - Ensure adequate nutrition and hydration,  recommending Pedialyte if experiencing vomiting or diarrhea. - AST elevated greater than ALT.  No signs of anemia.  There was some macrocytosis on CBC from emergency room.  - Most likely to resolve with alcohol  discontinuation and improve dietary intake  Disposition: Follow-up as needed  Patient Instructions  Referral placed to [facility].  I will also like to check the liver enzymes in about a week.  Monitor for any tremors, confusion, vomiting, or seizures.  If seizure occurs please call EMS.   This note has been created using automated tools and reviewed for accuracy by provider.  Patient received an After Visit Summary    Attestation Statement:   I personally performed the service, non-incident to. [facility])  MASON MCCLELLAND MINOR, PA

## 2024-01-19 NOTE — Progress Notes (Signed)
 ENCOUNTER: Patient Class :No patient class for patient encounter Department: [facility] CLINIC [address] [place] [address]  PATIENT: Patient Demographics      Name Patient ID SSN Gender Identity Birth Date   Letcher, Schweikert [id number] Male 08-Jun-2061 (63 yrs)          Address Phone Email       [address] [place] [address] [phone] ([email or web address]            [facility] Caucasian/White             Reg Status PCP Date Last Verified Next Review Date     Verified Rudolpho Norleen Lenis [phone] 01/18/24 02/17/24           Marital Status Religion Language       Divorced Christian English              EMERGENCY CONTACT: Name Relationship Lgl Grd Work Marine scientist Phone  1. SKYLOR, SCHNAPP or Daughter    [phone]    GUARANTOR: There is no guarantor information entered for this encounter.  COVERAGE: Primary Visit Coverage      Payer Plan Group Number Group Name Payer Phone Plan Phone   No coverage found                Secondary Visit Coverage      Payer Plan Group Number Group Name Payer Phone Plan Phone   No coverage found                Primary Coverage      Payer Plan Group Number Group Name Payer Phone Plan Phone   BCBS MEDICARE ADVANTAGE PLAN [place] MEDICARE [facility] [place] [id number] Medicare Individual Group [id number]             Primary Subscriber      Subscriber ID Subscriber Name [facility] Subscriber Address   [id number] [place], [place] [id number] [address]      [place], [place] [address]           Secondary Coverage      Payer Plan Group Number Group Name Payer Phone Plan Phone   No coverage found

## 2024-01-22 ENCOUNTER — Observation Stay

## 2024-01-22 ENCOUNTER — Other Ambulatory Visit: Payer: Self-pay

## 2024-01-22 ENCOUNTER — Emergency Department

## 2024-01-22 ENCOUNTER — Encounter: Payer: Self-pay | Admitting: Emergency Medicine

## 2024-01-22 ENCOUNTER — Inpatient Hospital Stay
Admission: EM | Admit: 2024-01-22 | Discharge: 2024-01-28 | DRG: 896 | Disposition: A | Discharge status: Home / Self Care | Attending: Internal Medicine | Admitting: Internal Medicine

## 2024-01-22 DIAGNOSIS — F10931 Alcohol use, unspecified with withdrawal delirium: Principal | ICD-10-CM | POA: Diagnosis present

## 2024-01-22 DIAGNOSIS — D649 Anemia, unspecified: Secondary | ICD-10-CM | POA: Diagnosis present

## 2024-01-22 DIAGNOSIS — D696 Thrombocytopenia, unspecified: Secondary | ICD-10-CM | POA: Diagnosis present

## 2024-01-22 DIAGNOSIS — I10 Essential (primary) hypertension: Secondary | ICD-10-CM | POA: Diagnosis present

## 2024-01-22 DIAGNOSIS — Z860100 Personal history of colon polyps, unspecified: Secondary | ICD-10-CM

## 2024-01-22 DIAGNOSIS — J690 Pneumonitis due to inhalation of food and vomit: Secondary | ICD-10-CM | POA: Diagnosis present

## 2024-01-22 DIAGNOSIS — F05 Delirium due to known physiological condition: Secondary | ICD-10-CM | POA: Diagnosis present

## 2024-01-22 DIAGNOSIS — R0902 Hypoxemia: Secondary | ICD-10-CM

## 2024-01-22 DIAGNOSIS — F10231 Alcohol dependence with withdrawal delirium: Principal | ICD-10-CM | POA: Diagnosis present

## 2024-01-22 DIAGNOSIS — E876 Hypokalemia: Secondary | ICD-10-CM | POA: Diagnosis present

## 2024-01-22 DIAGNOSIS — B951 Streptococcus, group B, as the cause of diseases classified elsewhere: Secondary | ICD-10-CM | POA: Diagnosis present

## 2024-01-22 DIAGNOSIS — G928 Other toxic encephalopathy: Secondary | ICD-10-CM | POA: Diagnosis present

## 2024-01-22 DIAGNOSIS — Z882 Allergy status to sulfonamides status: Secondary | ICD-10-CM | POA: Diagnosis not present

## 2024-01-22 DIAGNOSIS — J9601 Acute respiratory failure with hypoxia: Secondary | ICD-10-CM | POA: Diagnosis present

## 2024-01-22 DIAGNOSIS — E785 Hyperlipidemia, unspecified: Secondary | ICD-10-CM | POA: Diagnosis present

## 2024-01-22 DIAGNOSIS — D7589 Other specified diseases of blood and blood-forming organs: Secondary | ICD-10-CM | POA: Diagnosis present

## 2024-01-22 DIAGNOSIS — Z888 Allergy status to other drugs, medicaments and biological substances status: Secondary | ICD-10-CM | POA: Diagnosis not present

## 2024-01-22 DIAGNOSIS — G9341 Metabolic encephalopathy: Secondary | ICD-10-CM | POA: Diagnosis not present

## 2024-01-22 LAB — CBC WITH DIFFERENTIAL/PLATELET
Abs Immature Granulocytes: 0.02 K/uL (ref 0.00–0.07)
Basophils Absolute: 0 K/uL (ref 0.0–0.1)
Basophils Relative: 1 %
Eosinophils Absolute: 0.2 K/uL (ref 0.0–0.5)
Eosinophils Relative: 3 %
HCT: 34.8 % — ABNORMAL LOW (ref 39.0–52.0)
Hemoglobin: 12.2 g/dL — ABNORMAL LOW (ref 13.0–17.0)
Immature Granulocytes: 0 %
Lymphocytes Relative: 21 %
Lymphs Abs: 1.3 K/uL (ref 0.7–4.0)
MCH: 36 pg — ABNORMAL HIGH (ref 26.0–34.0)
MCHC: 35.1 g/dL (ref 30.0–36.0)
MCV: 102.7 fL — ABNORMAL HIGH (ref 80.0–100.0)
Monocytes Absolute: 0.9 K/uL (ref 0.1–1.0)
Monocytes Relative: 15 %
Neutro Abs: 3.6 K/uL (ref 1.7–7.7)
Neutrophils Relative %: 60 %
Platelets: 71 K/uL — ABNORMAL LOW (ref 150–400)
RBC: 3.39 MIL/uL — ABNORMAL LOW (ref 4.22–5.81)
RDW: 11.9 % (ref 11.5–15.5)
Smear Review: NORMAL
WBC: 6.1 K/uL (ref 4.0–10.5)
nRBC: 0 % (ref 0.0–0.2)

## 2024-01-22 LAB — FERRITIN: Ferritin: 244 ng/mL (ref 24–336)

## 2024-01-22 LAB — COMPREHENSIVE METABOLIC PANEL WITH GFR
ALT: 39 U/L (ref 0–44)
AST: 53 U/L — ABNORMAL HIGH (ref 15–41)
Albumin: 3.5 g/dL (ref 3.5–5.0)
Alkaline Phosphatase: 41 U/L (ref 38–126)
Anion gap: 11 (ref 5–15)
BUN: 10 mg/dL (ref 8–23)
CO2: 24 mmol/L (ref 22–32)
Calcium: 8.7 mg/dL — ABNORMAL LOW (ref 8.9–10.3)
Chloride: 100 mmol/L (ref 98–111)
Creatinine, Ser: 0.88 mg/dL (ref 0.61–1.24)
GFR, Estimated: >60 mL/min (ref >60)
Glucose, Bld: 94 mg/dL (ref 70–99)
Potassium: 4.2 mmol/L (ref 3.5–5.1)
Sodium: 135 mmol/L (ref 135–145)
Total Bilirubin: 0.9 mg/dL (ref 0.0–1.2)
Total Protein: 6.3 g/dL — ABNORMAL LOW (ref 6.5–8.1)

## 2024-01-22 LAB — BLOOD GAS, ARTERIAL
Acid-Base Excess: 6.7 mmol/L — ABNORMAL HIGH (ref 0.0–2.0)
Bicarbonate: 30.4 mmol/L — ABNORMAL HIGH (ref 20.0–28.0)
FIO2: 100 %
MECHVT: 500 mL
Mechanical Rate: 18
O2 Saturation: 100 %
PEEP: 5 cmH2O
Patient temperature: 37
pCO2 arterial: 39 mmHg (ref 32–48)
pH, Arterial: 7.5 — ABNORMAL HIGH (ref 7.35–7.45)
pO2, Arterial: 464 mmHg — ABNORMAL HIGH (ref 83–108)

## 2024-01-22 LAB — IRON AND TIBC
Iron: 88 ug/dL (ref 45–182)
Saturation Ratios: 32 % (ref 17.9–39.5)
TIBC: 276 ug/dL (ref 250–450)
UIBC: 188 ug/dL

## 2024-01-22 LAB — URINE DRUG SCREEN, QUALITATIVE (ARMC ONLY)
Amphetamines, Ur Screen: NOT DETECTED
Barbiturates, Ur Screen: POSITIVE — AB
Benzodiazepine, Ur Scrn: NOT DETECTED
Cannabinoid 50 Ng, Ur ~~LOC~~: NOT DETECTED
Cocaine Metabolite,Ur ~~LOC~~: NOT DETECTED
MDMA (Ecstasy)Ur Screen: NOT DETECTED
Methadone Scn, Ur: NOT DETECTED
Opiate, Ur Screen: NOT DETECTED
Phencyclidine (PCP) Ur S: NOT DETECTED
Tricyclic, Ur Screen: NOT DETECTED

## 2024-01-22 LAB — GLUCOSE, CAPILLARY
Glucose-Capillary: 65 mg/dL — ABNORMAL LOW (ref 70–99)
Glucose-Capillary: 74 mg/dL (ref 70–99)
Glucose-Capillary: 61 mg/dL — ABNORMAL LOW (ref 70–99)
Glucose-Capillary: 131 mg/dL — ABNORMAL HIGH (ref 70–99)
Glucose-Capillary: 113 mg/dL — ABNORMAL HIGH (ref 70–99)

## 2024-01-22 LAB — URINALYSIS, ROUTINE W REFLEX MICROSCOPIC
Bilirubin Urine: NEGATIVE
Glucose, UA: NEGATIVE mg/dL
Hgb urine dipstick: NEGATIVE
Ketones, ur: NEGATIVE mg/dL
Leukocytes,Ua: NEGATIVE
Nitrite: NEGATIVE
Protein, ur: NEGATIVE mg/dL
Specific Gravity, Urine: 1.001 — ABNORMAL LOW (ref 1.005–1.030)
pH: 7 (ref 5.0–8.0)

## 2024-01-22 LAB — RETICULOCYTES
Immature Retic Fract: 10.5 % (ref 2.3–15.9)
RBC.: 3.44 MIL/uL — ABNORMAL LOW (ref 4.22–5.81)
Retic Count, Absolute: 40.2 K/uL (ref 19.0–186.0)
Retic Ct Pct: 1.2 % (ref 0.4–3.1)

## 2024-01-22 LAB — MRSA NEXT GEN BY PCR, NASAL: MRSA by PCR Next Gen: NOT DETECTED

## 2024-01-22 LAB — MAGNESIUM: Magnesium: 1.6 mg/dL — ABNORMAL LOW (ref 1.7–2.4)

## 2024-01-22 LAB — VITAMIN B12: Vitamin B-12: 381 pg/mL (ref 180–914)

## 2024-01-22 LAB — HEMOGLOBIN: Hemoglobin: 12.8 g/dL — ABNORMAL LOW (ref 13.0–17.0)

## 2024-01-22 LAB — FOLATE: Folate: 20.6 ng/mL (ref >5.9)

## 2024-01-22 LAB — HIV ANTIBODY (ROUTINE TESTING W REFLEX): HIV Screen 4th Generation wRfx: NONREACTIVE

## 2024-01-22 MED ORDER — ETOMIDATE 2 MG/ML IV SOLN
20.0000 mg | Freq: Once | INTRAVENOUS | Status: AC
  Administered 2024-01-22: 20 mg via INTRAVENOUS

## 2024-01-22 MED ORDER — DEXTROSE 50 % IV SOLN
12.5000 g | INTRAVENOUS | Status: AC
  Administered 2024-01-22: 12.5 g via INTRAVENOUS
  Filled 2024-01-22: qty 50

## 2024-01-22 MED ORDER — CHLORDIAZEPOXIDE HCL 25 MG PO CAPS: 25.0000 mg | ORAL_CAPSULE | ORAL | Status: AC

## 2024-01-22 MED ORDER — ETOMIDATE 2 MG/ML IV SOLN
INTRAVENOUS | Status: AC
Start: 2024-01-22 — End: 2024-01-22
  Filled 2024-01-22: qty 10

## 2024-01-22 MED ORDER — SODIUM CHLORIDE 0.9 % IV SOLN: 250.0000 mL | INTRAVENOUS | Status: AC

## 2024-01-22 MED ORDER — PROPOFOL 1000 MG/100ML IV EMUL
INTRAVENOUS | Status: AC
  Filled 2024-01-22: qty 100

## 2024-01-22 MED ORDER — POLYETHYLENE GLYCOL 3350 17 G PO PACK
17.0000 g | PACK | Freq: Every day | ORAL | Status: DC
  Administered 2024-01-22 – 2024-01-23 (×2): 17 g
  Filled 2024-01-22 (×2): qty 1

## 2024-01-22 MED ORDER — NOREPINEPHRINE 4 MG/250ML-% IV SOLN
INTRAVENOUS | Status: AC
Start: 2024-01-22 — End: 2024-01-22
  Administered 2024-01-22: 2 ug/min via INTRAVENOUS
  Filled 2024-01-22: qty 250

## 2024-01-22 MED ORDER — MIDAZOLAM HCL 2 MG/2ML IJ SOLN
4.0000 mg | Freq: Once | INTRAMUSCULAR | Status: AC
  Administered 2024-01-22: 4 mg via INTRAVENOUS
  Filled 2024-01-22: qty 4

## 2024-01-22 MED ORDER — DEXTROSE 50 % IV SOLN
25.0000 mL | Freq: Once | INTRAVENOUS | Status: AC
  Administered 2024-01-22: 25 mL via INTRAVENOUS

## 2024-01-22 MED ORDER — DOCUSATE SODIUM 100 MG PO CAPS
100.0000 mg | ORAL_CAPSULE | Freq: Two times a day (BID) | ORAL | Status: DC | PRN
Start: 2024-01-22 — End: 2024-01-22

## 2024-01-22 MED ORDER — ADULT MULTIVITAMIN W/MINERALS CH: 1.0000 | ORAL_TABLET | Freq: Every day | ORAL | Status: DC

## 2024-01-22 MED ORDER — DOCUSATE SODIUM 50 MG/5ML PO LIQD: 100.0000 mg | Freq: Two times a day (BID) | ORAL | Status: DC | PRN

## 2024-01-22 MED ORDER — DEXTROSE 50 % IV SOLN
INTRAVENOUS | Status: AC
Start: 2024-01-22 — End: 2024-01-22
  Filled 2024-01-22: qty 50

## 2024-01-22 MED ORDER — ROCURONIUM BROMIDE 10 MG/ML (PF) SYRINGE
100.0000 mg | PREFILLED_SYRINGE | Freq: Once | INTRAVENOUS | Status: AC
  Administered 2024-01-22: 100 mg via INTRAVENOUS

## 2024-01-22 MED ORDER — ACETAMINOPHEN 325 MG PO TABS: 650.0000 mg | ORAL_TABLET | Freq: Four times a day (QID) | ORAL | Status: DC | PRN

## 2024-01-22 MED ORDER — CHLORDIAZEPOXIDE HCL 25 MG PO CAPS: 25.0000 mg | ORAL_CAPSULE | Freq: Every day | ORAL | Status: DC

## 2024-01-22 MED ORDER — FENTANYL BOLUS VIA INFUSION
100.0000 ug | Freq: Once | INTRAVENOUS | Status: AC
  Administered 2024-01-22: 100 ug via INTRAVENOUS
  Filled 2024-01-22: qty 100

## 2024-01-22 MED ORDER — CHLORHEXIDINE GLUCONATE CLOTH 2 % EX PADS
6.0000 | MEDICATED_PAD | Freq: Every day | CUTANEOUS | Status: DC
  Administered 2024-01-22 – 2024-01-25 (×4): 6 via TOPICAL
  Filled 2024-01-22: qty 6

## 2024-01-22 MED ORDER — HYDROXYZINE HCL 25 MG PO TABS
25.0000 mg | ORAL_TABLET | Freq: Four times a day (QID) | ORAL | Status: DC | PRN
  Administered 2024-01-23: 25 mg
  Filled 2024-01-22: qty 1

## 2024-01-22 MED ORDER — ENOXAPARIN SODIUM 40 MG/0.4ML IJ SOSY: 40.0000 mg | PREFILLED_SYRINGE | INTRAMUSCULAR | Status: DC

## 2024-01-22 MED ORDER — FOLIC ACID 1 MG PO TABS
1.0000 mg | ORAL_TABLET | Freq: Every day | ORAL | Status: DC
  Administered 2024-01-22: 1 mg via ORAL
  Filled 2024-01-22: qty 1

## 2024-01-22 MED ORDER — ONDANSETRON HCL 4 MG/2ML IJ SOLN: 4.0000 mg | Freq: Four times a day (QID) | INTRAMUSCULAR | Status: DC | PRN

## 2024-01-22 MED ORDER — DIAZEPAM 5 MG/ML IJ SOLN
10.0000 mg | Freq: Once | INTRAMUSCULAR | Status: AC
  Administered 2024-01-22: 10 mg via INTRAVENOUS
  Filled 2024-01-22: qty 2

## 2024-01-22 MED ORDER — PANTOPRAZOLE SODIUM 40 MG IV SOLR
40.0000 mg | INTRAVENOUS | Status: DC
  Administered 2024-01-22 – 2024-01-25 (×4): 40 mg via INTRAVENOUS
  Filled 2024-01-22 (×4): qty 10

## 2024-01-22 MED ORDER — LORAZEPAM 2 MG/ML IJ SOLN
1.0000 mg | INTRAMUSCULAR | Status: DC | PRN
  Filled 2024-01-22: qty 2

## 2024-01-22 MED ORDER — DEXTROSE IN LACTATED RINGERS 5 % IV SOLN: INTRAVENOUS | Status: AC

## 2024-01-22 MED ORDER — FREE WATER
30.0000 mL | Status: DC
  Administered 2024-01-22 – 2024-01-24 (×11): 30 mL

## 2024-01-22 MED ORDER — PHENOBARBITAL SODIUM 130 MG/ML IJ SOLN
130.0000 mg | Freq: Once | INTRAMUSCULAR | Status: AC
  Administered 2024-01-22: 130 mg via INTRAVENOUS
  Filled 2024-01-22: qty 1

## 2024-01-22 MED ORDER — THIAMINE HCL 100 MG/ML IJ SOLN: 100.0000 mg | Freq: Every day | INTRAMUSCULAR | Status: DC

## 2024-01-22 MED ORDER — HEPARIN SODIUM (PORCINE) 5000 UNIT/ML IJ SOLN
5000.0000 [IU] | Freq: Two times a day (BID) | INTRAMUSCULAR | Status: DC
  Administered 2024-01-22 – 2024-01-27 (×10): 5000 [IU] via SUBCUTANEOUS
  Filled 2024-01-22 (×10): qty 1

## 2024-01-22 MED ORDER — ONDANSETRON HCL 4 MG PO TABS: 4.0000 mg | ORAL_TABLET | Freq: Four times a day (QID) | ORAL | Status: DC | PRN

## 2024-01-22 MED ORDER — ROCURONIUM BROMIDE 10 MG/ML (PF) SYRINGE
PREFILLED_SYRINGE | INTRAVENOUS | Status: AC
Start: 2024-01-22 — End: 2024-01-22
  Filled 2024-01-22: qty 10

## 2024-01-22 MED ORDER — THIAMINE HCL 100 MG/ML IJ SOLN
500.0000 mg | INTRAVENOUS | Status: AC
  Administered 2024-01-22 – 2024-01-24 (×3): 500 mg via INTRAVENOUS
  Filled 2024-01-22 (×3): qty 5

## 2024-01-22 MED ORDER — ACETAMINOPHEN 325 MG PO TABS
650.0000 mg | ORAL_TABLET | Freq: Four times a day (QID) | ORAL | Status: DC | PRN
  Administered 2024-01-23: 650 mg
  Filled 2024-01-22: qty 2

## 2024-01-22 MED ORDER — FOLIC ACID 5 MG/ML IJ SOLN: 1.0000 mg | Freq: Every day | INTRAMUSCULAR | Status: DC

## 2024-01-22 MED ORDER — POLYETHYLENE GLYCOL 3350 17 G PO PACK
17.0000 g | PACK | Freq: Every day | ORAL | Status: DC | PRN
  Filled 2024-01-22: qty 1

## 2024-01-22 MED ORDER — FAMOTIDINE 20 MG PO TABS: 20.0000 mg | ORAL_TABLET | Freq: Two times a day (BID) | ORAL | Status: DC

## 2024-01-22 MED ORDER — DOCUSATE SODIUM 50 MG/5ML PO LIQD
100.0000 mg | Freq: Two times a day (BID) | ORAL | Status: DC
  Administered 2024-01-22 – 2024-01-23 (×4): 100 mg
  Filled 2024-01-22 (×4): qty 10

## 2024-01-22 MED ORDER — LOPERAMIDE HCL 2 MG PO CAPS: 2.0000 mg | ORAL_CAPSULE | ORAL | Status: DC | PRN

## 2024-01-22 MED ORDER — ORAL CARE MOUTH RINSE
15.0000 mL | OROMUCOSAL | Status: DC
  Administered 2024-01-22 – 2024-01-24 (×22): 15 mL via OROMUCOSAL
  Filled 2024-01-22 (×4): qty 15

## 2024-01-22 MED ORDER — CHLORDIAZEPOXIDE HCL 25 MG PO CAPS
25.0000 mg | ORAL_CAPSULE | Freq: Three times a day (TID) | ORAL | Status: AC
  Administered 2024-01-23 (×2): 25 mg
  Filled 2024-01-22 (×2): qty 1

## 2024-01-22 MED ORDER — FENTANYL 2500MCG IN NS 250ML (10MCG/ML) PREMIX INFUSION
100.0000 ug/h | INTRAVENOUS | Status: DC
  Administered 2024-01-22 – 2024-01-23 (×3): 100 ug/h via INTRAVENOUS
  Filled 2024-01-22 (×3): qty 250

## 2024-01-22 MED ORDER — ACETAMINOPHEN 650 MG RE SUPP: 650.0000 mg | Freq: Four times a day (QID) | RECTAL | Status: DC | PRN

## 2024-01-22 MED ORDER — SODIUM CHLORIDE 0.9 % IV SOLN: INTRAVENOUS | Status: DC

## 2024-01-22 MED ORDER — VITAL HP 1.0 CAL PO LIQD: 1000.0000 mL | ORAL | Status: DC

## 2024-01-22 MED ORDER — CHLORDIAZEPOXIDE HCL 25 MG PO CAPS: 25.0000 mg | ORAL_CAPSULE | Freq: Four times a day (QID) | ORAL | Status: DC | PRN

## 2024-01-22 MED ORDER — PROPOFOL 10 MG/ML IV BOLUS: 150.0000 mg | Freq: Once | INTRAVENOUS | Status: DC

## 2024-01-22 MED ORDER — MAGNESIUM SULFATE 4 GM/100ML IV SOLN
4.0000 g | Freq: Once | INTRAVENOUS | Status: AC
  Administered 2024-01-22: 4 g via INTRAVENOUS
  Filled 2024-01-22: qty 100

## 2024-01-22 MED ORDER — THIAMINE HCL 100 MG/ML IJ SOLN
100.0000 mg | INTRAMUSCULAR | Status: DC
  Administered 2024-01-25: 100 mg via INTRAVENOUS
  Filled 2024-01-22: qty 2

## 2024-01-22 MED ORDER — PROPOFOL 1000 MG/100ML IV EMUL
0.0000 ug/kg/min | INTRAVENOUS | Status: DC
  Administered 2024-01-22 – 2024-01-23 (×6): 50 ug/kg/min via INTRAVENOUS
  Filled 2024-01-22 (×5): qty 100

## 2024-01-22 MED ORDER — VITAL AF 1.2 CAL PO LIQD
1000.0000 mL | ORAL | Status: DC
  Administered 2024-01-22 – 2024-01-23 (×2): 1000 mL

## 2024-01-22 MED ORDER — ORAL CARE MOUTH RINSE: 15.0000 mL | OROMUCOSAL | Status: DC | PRN

## 2024-01-22 MED ORDER — THIAMINE MONONITRATE 100 MG PO TABS: 100.0000 mg | ORAL_TABLET | Freq: Every day | ORAL | Status: DC

## 2024-01-22 MED ORDER — PROPOFOL 1000 MG/100ML IV EMUL
0.0000 ug/kg/min | INTRAVENOUS | Status: DC
  Administered 2024-01-22: 30 ug/kg/min via INTRAVENOUS

## 2024-01-22 MED ORDER — LORAZEPAM 1 MG PO TABS: 1.0000 mg | ORAL_TABLET | ORAL | Status: DC | PRN

## 2024-01-22 MED ORDER — ADULT MULTIVITAMIN W/MINERALS CH
1.0000 | ORAL_TABLET | Freq: Every day | ORAL | Status: DC
  Administered 2024-01-22: 1 via ORAL
  Filled 2024-01-22: qty 1

## 2024-01-22 MED ORDER — FOLIC ACID 1 MG PO TABS
1.0000 mg | ORAL_TABLET | Freq: Every day | ORAL | Status: DC
  Administered 2024-01-23: 1 mg
  Filled 2024-01-22: qty 1

## 2024-01-22 MED ORDER — FENTANYL BOLUS VIA INFUSION
25.0000 ug | INTRAVENOUS | Status: DC | PRN
  Administered 2024-01-22: 50 ug via INTRAVENOUS
  Administered 2024-01-22: 100 ug via INTRAVENOUS
  Administered 2024-01-22: 50 ug via INTRAVENOUS
  Administered 2024-01-23 (×2): 100 ug via INTRAVENOUS
  Administered 2024-01-23 – 2024-01-24 (×3): 50 ug via INTRAVENOUS

## 2024-01-22 MED ORDER — NOREPINEPHRINE 4 MG/250ML-% IV SOLN
0.0000 ug/min | INTRAVENOUS | Status: DC
  Administered 2024-01-23: 6 ug/min via INTRAVENOUS
  Filled 2024-01-22: qty 250

## 2024-01-22 MED ORDER — LACTATED RINGERS IV BOLUS
1000.0000 mL | Freq: Once | INTRAVENOUS | Status: AC
  Administered 2024-01-22: 1000 mL via INTRAVENOUS

## 2024-01-22 MED ORDER — CHLORDIAZEPOXIDE HCL 25 MG PO CAPS
25.0000 mg | ORAL_CAPSULE | Freq: Four times a day (QID) | ORAL | Status: AC
  Administered 2024-01-22 – 2024-01-23 (×4): 25 mg
  Filled 2024-01-22 (×4): qty 1

## 2024-01-22 MED ORDER — ACETAMINOPHEN 650 MG RE SUPP
650.0000 mg | Freq: Four times a day (QID) | RECTAL | Status: DC | PRN
Start: 2024-01-22 — End: 2024-01-22

## 2024-01-22 MED ORDER — ADULT MULTIVITAMIN W/MINERALS CH
1.0000 | ORAL_TABLET | Freq: Every day | ORAL | Status: DC
  Administered 2024-01-23: 1
  Filled 2024-01-22: qty 1

## 2024-01-22 MED ORDER — LORAZEPAM 2 MG/ML IJ SOLN: 1.0000 mg | INTRAMUSCULAR | Status: DC | PRN

## 2024-01-22 MED ORDER — MIDAZOLAM HCL 2 MG/2ML IJ SOLN
1.0000 mg | INTRAMUSCULAR | Status: DC | PRN
  Administered 2024-01-23 – 2024-01-24 (×8): 2 mg via INTRAVENOUS
  Filled 2024-01-22 (×10): qty 2

## 2024-01-22 MED ORDER — SODIUM CHLORIDE 0.9 % IV BOLUS
1000.0000 mL | Freq: Once | INTRAVENOUS | Status: AC
  Administered 2024-01-22: 1000 mL via INTRAVENOUS

## 2024-01-22 MED ORDER — THIAMINE HCL 100 MG/ML IJ SOLN: 100.0000 mg | Freq: Once | INTRAMUSCULAR | Status: DC

## 2024-01-22 NOTE — ED Notes (Signed)
 Pt's bed linen changed and pt placed in a gown. Pt on cardiac monitor.

## 2024-01-22 NOTE — ED Notes (Signed)
 Another 50 mg Propofol  given by Dr Claudene

## 2024-01-22 NOTE — Progress Notes (Signed)
 Hypoglycemic Event  CBG: 65  Treatment: D50 25 mL (12.5 gm)  Symptoms: None  Follow-up CBG: Time:1013 CBG Result:74  Possible Reasons for Event: Inadequate meal intake  Comments/MD notified:Jeremiah NP    Ryan Rivers

## 2024-01-22 NOTE — ED Notes (Signed)
 Pillows provided to pt for comfort and skin integrity. Pt placed on right side at this time.

## 2024-01-22 NOTE — ED Notes (Signed)
 Pt placed on 2L [place] at this time. Pt resting w/ eyes closed. Equal rise and fall of chest noted

## 2024-01-22 NOTE — ED Provider Notes (Signed)
 [facility] Provider Note    Event Date/Time   First MD Initiated Contact with Patient 01/22/24 [phone]     (approximate)   History   Hallucinations   HPI  Ryan Rivers is a 63 y.o. male who presents to the ED for evaluation of Hallucinations   Patient presents to the ED from [facility] for evaluation of encephalopathy, hallucinations and confusion.  On review of MAR, he was at [facility] for the past 2 days, receiving oral medications for alcohol  withdrawals.    Physical Exam   Triage Vital Signs: ED Triage Vitals  Encounter Vitals Group     BP 01/22/24 0048 130/69     Girls Systolic BP Percentile --      Girls Diastolic BP Percentile --      Boys Systolic BP Percentile --      Boys Diastolic BP Percentile --      Pulse Rate 01/22/24 0048 77     Resp 01/22/24 0048 16     Temp 01/22/24 0048 98.3 F (36.8 C)     Temp Source 01/22/24 0048 Oral     SpO2 01/22/24 0048 98 %     Weight 01/22/24 0600 156 lb 1.4 oz (70.8 kg)     Height 01/22/24 0049 5' 8 (1.727 m)     Head Circumference --      Peak Flow --      Pain Score 01/22/24 0048 0     Pain Loc --      Pain Education --      Exclude from Growth Chart --     Most recent vital signs: Vitals:   01/22/24 0651 01/22/24 0657  BP: 120/78 (!) 165/92  Pulse: (!) 59   Resp: 18 18  Temp:    SpO2: 100% 100%    General: Awake, no distress.  Delirious, talking about parties, being in jail, the 6 girls upstairs, tangential thoughts and not really making any sense. CV:  Good peripheral perfusion.  Resp:  Normal effort.  Abd:  No distention.  MSK:  No deformity noted.  No signs of trauma Neuro:  No focal deficits appreciated.  No seizure activity Other:     ED Results / Procedures / Treatments   Labs (all labs ordered are listed, but only abnormal results are displayed) Labs Reviewed  COMPREHENSIVE METABOLIC PANEL WITH GFR - Abnormal; Notable for the following components:      Result Value   Calcium  8.7 (*)    Total Protein 6.3 (*)    AST 53 (*)    All other components within normal limits  CBC WITH DIFFERENTIAL/PLATELET - Abnormal; Notable for the following components:   RBC 3.39 (*)    Hemoglobin 12.2 (*)    HCT 34.8 (*)    MCV 102.7 (*)    MCH 36.0 (*)    Platelets 71 (*)    All other components within normal limits  MAGNESIUM  - Abnormal; Notable for the following components:   Magnesium  1.6 (*)    All other components within normal limits  URINALYSIS, ROUTINE W REFLEX MICROSCOPIC - Abnormal; Notable for the following components:   Color, Urine COLORLESS (*)    APPearance CLEAR (*)    Specific Gravity, Urine 1.001 (*)    All other components within normal limits  RETICULOCYTES - Abnormal; Notable for the following components:   RBC. 3.44 (*)    All other components within normal limits  MRSA NEXT GEN BY PCR, NASAL  FOLATE  IRON AND TIBC  FERRITIN  VITAMIN B12  HEMOGLOBIN  HIV ANTIBODY (ROUTINE TESTING W REFLEX)  BLOOD GAS, ARTERIAL    EKG   RADIOLOGY CT head interpreted by me without evidence of acute intracranial pathology 1 view CXR interpreted by me with the ETT in good position  Official radiology report(s): DG Chest Portable 1 View Result Date: 01/22/2024 CLINICAL DATA:  63 year old male intubated. Alcohol  withdrawal, hallucination. EXAM: PORTABLE CHEST 1 VIEW COMPARISON:  Chest radiographs 08/15/2011. FINDINGS: Portable AP supine views at 0529 hours. Enteric tube placed into the stomach, tip at the gastric cardia. Endotracheal tube tip in good position between the clavicles and carina. Lower lung volumes. Mildly accentuated cardiac and mediastinal contours. Allowing for portable technique the lungs are clear. No acute osseous abnormality identified. Nonobstructed visible bowel gas pattern. IMPRESSION: 1. Satisfactory ET tube and enteric tube placement. 2. Lower lung volumes. No acute cardiopulmonary abnormality. Electronically Signed   By: VEAR Hurst M.D.   On:  01/22/2024 06:42   CT HEAD WO CONTRAST ( ) Result Date: 01/22/2024 CLINICAL DATA:  Initial evaluation for acute altered mental status. EXAM: CT HEAD WITHOUT CONTRAST TECHNIQUE: Contiguous axial images were obtained from the base of the skull through the vertex without intravenous contrast. RADIATION DOSE REDUCTION: This exam was performed according to the departmental dose-optimization program which includes automated exposure control, adjustment of the mA and/or kV according to patient size and/or use of iterative reconstruction technique. COMPARISON:  CT from 11/24/2018. FINDINGS: Brain: Cerebral volume within normal limits. Mild chronic microvascular ischemic disease. No acute intracranial hemorrhage. No acute large vessel territory infarct. No mass lesion or midline shift. No hydrocephalus or extra-axial fluid collection. Vascular: No abnormal hyperdense vessel. Calcified atherosclerosis present at skull base with no made of a focal calcified plaque in the right M1 segment. Skull: Scalp soft tissues within normal limits.  Calvarium intact. Sinuses/Orbits: Left gaze noted. Globes orbital soft tissues otherwise unremarkable. Mild scattered mucosal thickening present about the ethmoidal air cells. Small right sphenoid sinus retention cyst. Paranasal sinuses are otherwise clear. No mastoid effusion. Other: None. IMPRESSION: 1. No acute intracranial abnormality. 2. Mild chronic microvascular ischemic disease. Electronically Signed   By: Morene Hoard M.D.   On: 01/22/2024 03:35    PROCEDURES and INTERVENTIONS:  .Critical Care  Performed by: Claudene Rover, MD Authorized by: Claudene Rover, MD   Critical care provider statement:    Critical care time (minutes):  30   Critical care time was exclusive of:  Separately billable procedures and treating other patients   Critical care was necessary to treat or prevent imminent or life-threatening deterioration of the following conditions:  Toxidrome    Critical care was time spent personally by me on the following activities:  Development of treatment plan with patient or surrogate, discussions with consultants, evaluation of patient's response to treatment, examination of patient, ordering and review of laboratory studies, ordering and review of radiographic studies, ordering and performing treatments and interventions, pulse oximetry, re-evaluation of patient's condition and review of old charts Procedure Name: Intubation Date/Time: 01/22/2024 7:24 AM  Performed by: Claudene Rover, MDPre-anesthesia Checklist: Patient identified, Patient being monitored, Emergency Drugs available, Timeout performed and Suction available Oxygen Delivery Method: Non-rebreather mask Preoxygenation: Pre-oxygenation with 100% oxygen Induction Type: Rapid sequence Ventilation: Mask ventilation without difficulty Laryngoscope Size: Glidescope and 3 Tube size: 7.5 mm Number of attempts: 1 Airway Equipment and Method: Rigid stylet Placement Confirmation: ETT inserted through vocal cords under direct vision, CO2 detector and Breath  sounds checked- equal and bilateral Secured at: 24 cm Tube secured with: ETT holder    .1-3 Lead EKG Interpretation  Performed by: Claudene Rover, MD Authorized by: Claudene Rover, MD     Interpretation: normal     ECG rate:  66   ECG rate assessment: normal     Rhythm: sinus rhythm     Ectopy: none     Conduction: normal     Medications  LORazepam  (ATIVAN ) tablet 1-4 mg (has no administration in time range)    Or  LORazepam  (ATIVAN ) injection 1-4 mg (has no administration in time range)  thiamine  (VITAMIN B1) tablet 100 mg (has no administration in time range)    Or  thiamine  (VITAMIN B1) injection 100 mg (has no administration in time range)  folic acid  (FOLVITE ) tablet 1 mg (has no administration in time range)  multivitamin with minerals tablet 1 tablet (has no administration in time range)  enoxaparin  (LOVENOX ) injection 40  mg (has no administration in time range)  acetaminophen  (TYLENOL ) tablet 650 mg (has no administration in time range)    Or  acetaminophen  (TYLENOL ) suppository 650 mg (has no administration in time range)  ondansetron  (ZOFRAN ) tablet 4 mg (has no administration in time range)    Or  ondansetron  (ZOFRAN ) injection 4 mg (has no administration in time range)  0.9 %  sodium chloride  infusion (has no administration in time range)  fentaNYL  in NS (59mcg/ml) infusion-PREMIX (100 mcg/hr Intravenous New Bag/Given 01/22/24 0516)  propofol  (DIPRIVAN ) 10 mg/mL bolus/IV push 150 mg ( Intravenous Not Given 01/22/24 0522)  propofol  (DIPRIVAN ) 1000 MG/100ML infusion (50 mcg/kg/min  70.8 kg Intravenous Other (enter comment in med admin window) 01/22/24 9366)  Chlorhexidine  Gluconate Cloth 2 % PADS 6 each (has no administration in time range)  docusate sodium  (COLACE) capsule 100 mg (has no administration in time range)  polyethylene glycol (MIRALAX  / GLYCOLAX ) packet 17 g (has no administration in time range)  famotidine  (PEPCID ) tablet 20 mg (has no administration in time range)  docusate (COLACE) 50 MG/5ML liquid 100 mg (has no administration in time range)  polyethylene glycol (MIRALAX  / GLYCOLAX ) packet 17 g (has no administration in time range)  Oral care mouth rinse (has no administration in time range)  Oral care mouth rinse (has no administration in time range)  fentaNYL  (SUBLIMAZE ) bolus via infusion 25-100 mcg (has no administration in time range)  magnesium  sulfate IVPB 4 g 100 mL (has no administration in time range)  lactated ringers  bolus 1,000 mL (0 mLs Intravenous Stopped 01/22/24 0214)  PHENObarbital  (LUMINAL) injection 130 mg (130 mg Intravenous Given 01/22/24 0113)  PHENObarbital  (LUMINAL) injection 130 mg (130 mg Intravenous Given 01/22/24 0201)  midazolam  (VERSED ) injection 4 mg (4 mg Intravenous Given 01/22/24 0220)  diazepam  (VALIUM ) injection 10 mg (10 mg Intravenous Given  01/22/24 0314)  etomidate  (AMIDATE ) injection 20 mg (20 mg Intravenous Not Given 01/22/24 0510)  rocuronium  (ZEMURON ) injection 100 mg (100 mg Intravenous Given 01/22/24 0510)  fentaNYL  (SUBLIMAZE ) bolus via infusion 100 mcg (100 mcg Intravenous Bolus from Bag 01/22/24 0521)  diazepam  (VALIUM ) injection 10 mg (10 mg Intravenous Given 01/22/24 0540)     IMPRESSION / MDM / ASSESSMENT AND PLAN / ED COURSE  I reviewed the triage vital signs and the nursing notes.  Differential diagnosis includes, but is not limited to, postictal, seizure, DTs, sepsis or other metabolic encephalopathy, ICH or stroke  {Patient presents with symptoms of an acute illness or injury that is potentially  life-threatening.  Patient presents with a nonfocal delirious exam in the setting of being managed at another facility for alcohol  withdrawals, consistent with DTs, decompensating well in the ED and ultimately requiring intubation and ICU admission.  No seizure activity.  His blood work is essentially normal with CBC, metabolic panel and electrolytes, UA without acute features.  Clear CT head.  Nonfocal exam.  As below I consulted with hospitalist for admission, shortly after this time called to the room due to a combative patient despite the various medications we have provided.  Swinging at staff, spitting and to be ultimately intubate the patient for staff and patient safety.  Clinical Course as of 01/22/24 0725  Fri Jan 22, 2024  0155 reassessed [DS]  0214 reassessed [DS]  0220 Reassessed.  Some redness at IV insertion site after second dose of phenobarbital .  No other signs of allergic reaction.  We will transition to benzodiazepines.  Will continue to monitor. [DS]  0410 I consulted medicine who agrees to admit [DS]  [phone] After admission and hospitalist had placed orders, I am called to the room as patient is combative.  I hear yelling from across the ER, for nurses holding down his arms as patient is spitting,  swinging at nurses. Not verbally redirectable . I therefore make the decision the intubate for staff safety and to facilitate safe working environment.  [DS]  0520 I update Dr. Cleatus of this [DS]  [phone] I consult with ICU who agrees to admit [DS]    Clinical Course User Index [DS] Claudene Rover, MD     FINAL CLINICAL IMPRESSION(S) / ED DIAGNOSES   Final diagnoses:  DTs (delirium tremens) (HCC)     Rx / DC Orders   ED Discharge Orders     None        Note:  This document was prepared using Dragon voice recognition software and may include unintentional dictation errors.   Claudene Rover, MD 01/22/24 [phone]

## 2024-01-22 NOTE — ED Notes (Signed)
 Pt's bed alarm alerted this RN. Pt was pulling off monitoring wires and trying to get out of bed. Attempted to deescalate and replace monitoring wires, pt became irate and started hitting at staff. Another RN called to bedside to help control situation, pt started kicking, punching, and spitting at staff. Claudene, MD called to bedside and decision to intubate was made.

## 2024-01-22 NOTE — Consult Note (Signed)
 NAME:  Ryan Rivers, MRN:  [id number], DOB:  1960/11/09, LOS: 0 ADMISSION DATE:  01/22/2024, CONSULTATION DATE:  01/22/2024 REFERRING MD:  Dr Claudene, CHIEF COMPLAINT:  Alcohol  Withdrawal    Brief Pt Description / Synopsis:  63 y.o. male admitted with Acute Metabolic Encephalopathy in the setting of severe Alcohol  Withdrawal with DT's requiring intubation and mechanical ventilation.  History of Present Illness:  Ryan Rivers is a 63 y.o. male with a past medical history significant for alcohol  abuse (up to 12 drinks daily) and hypertension who presented to [facility] ED on 01/22/24 for evaluation of hallucinations and altered mental status.  Patient is currently intubated and unable to contribute to history, therefore history is obtained from chart review.   The patient has been at the [facility] for alcohol  detox for the past 2 days.  He reported his last drink was on Sunday 01/17/24. Patient was seen in the ED on 8/10 after his family brought him in for excessive drinking. Workup showed double-digit transaminitis. He followed up with his PCP the following day and was referred to [facility] for detox.  He was being treated with Phenobarbital , however last night 8/14 started having hallucinations.   ED Course: Initial Vital Signs: Temperature 98.3 F, pulse 77, RR 16, BP 130/69, SpO2 98% Significant Labs: Magnesium  1.6, AST 53, Hgb 12.2, Hct 34.8, MCV 102.7, MCH 36, platelets 71 UA negative for UTI UDS + for barbiturates Imaging Chest X-ray>>IMPRESSION: 1. Satisfactory ET tube and enteric tube placement. 2. Lower lung volumes. No acute cardiopulmonary abnormality. CT Head wo contrast>>IMPRESSION: 1. No acute intracranial abnormality. 2. Mild chronic microvascular ischemic disease. Medications Administered:  phenobarbital  IV 130 mg with a repeat dose however he continued to hallucinate, becoming agitated whereupon he was given IV midazolam  and subsequently diazepam  with some improvement.     After sedation, he became hypoxic with O2 sats of 85% of which he was placed on 2L nasal cannula, reported to be protecting his airway.  TRH was planning to admit,  however he became extremely combative and agressive with medical staff.  Subsequently required intubation and mechanical ventilation for both patient and staff safety.   PCCM asked to admit for further workup and treatment.  Please see Significant Hospital Events section below for full detailed hospital course.   Pertinent  Medical History   Past Medical History:  Diagnosis Date   Abnormal LFTs (liver function tests)    Anxiety    Colon polyps    Erectile dysfunction    Hyperlipidemia    Hypertension    Insomnia    Microalbuminuria    Prostatitis     Micro Data:  8/15: MRSA PCR>> negative  Antimicrobials:   Anti-infectives (From admission, onward)    None       Significant Hospital Events: Including procedures, antibiotic start and stop dates in addition to other pertinent events   8/15: Presented to ED with hallucinations and severe ETOH withdrawal and DT's.  DT's worsened despite phenobarbital  and benzos, became very combative and aggressive with staff ultimately requiring intubation in the ED.  PCCM asked to admit.   Interim History / Subjective:  As outlined above under Significant Hospital Events section  Objective   Blood pressure (!) 165/92, pulse (!) 59, temperature 97.8 F (36.6 C), temperature source Axillary, resp. rate 18, height 5' 8 (1.727 m), weight 70.8 kg, SpO2 100%.    Vent Mode: PRVC FiO2 (%):  [100 %] 100 % Set Rate:  [18 bmp] 18 bmp  Vt Set:  [500 mL] 500 mL PEEP:  [5 cmH20] 5 cmH20  No intake or output data in the 24 hours ending 01/22/24 0735 Filed Weights   01/22/24 0600  Weight: 70.8 kg    Examination: General: Critically ill appearing male, laying in bed, intubated and sedated, in NAD HENT: Atraumatic, normocephalic, neck supple, no JVD, orally intubated Lungs:  Mechanical breath sounds throughout, even, nonlabored, synchronous with the vent Cardiovascular: Regular rate and rhythm, S1-S2, no murmurs, rubs, gallops Abdomen: Soft, nontender, nondistended, no guarding rebound tenderness, bowel sounds positive x 4 Extremities: Normal bulk and tone, no deformities, no edema, no cyanosis Neuro: Sedated, currently does not withdraw from pain or follow commands, pupils PERRLA GU: Foley catheter being placed  Resolved Hospital Problem list     Assessment & Plan:   #Acute Metabolic Encephalopathy #Severe Alcohol  Withdrawal with Delirium Tremens #Sedation needs in setting of mechanical ventilation -CT Head on presentation negative for acute intracranial abnormality  -Maintain a RASS goal of 0 to -1 -Fentanyl  and Propofol  to maintain RASS goal -Avoid sedating medications as able -Daily wake up assessment -CIWA protocol -Start Librium  taper  -High dose thiamine  x3 days, followed by 100 mg daily -Folate and MVI  #Intubated for airway protection and patient/staff safety #Acute Hypoxic Respiratory Failure -Full vent support, implement lung protective strategies -Plateau pressures less than 30 cm H20 -Wean FiO2 & PEEP as tolerated to maintain O2 sats >92% -Follow intermittent Chest X-ray & ABG as needed -Spontaneous Breathing Trials when respiratory parameters met and mental status permits -Implement VAP Bundle -Prn Bronchodilators  #Thrombocytopenia, suspect due to ETOH abuse #Anemia without overt s/sx of bleeding -Monitor for S/Sx of bleeding -Trend CBC -Heparin  SQ for VTE Prophylaxis (will do 5000 units BID dosing due to thrombocytopenia) -Transfuse for Hgb <7 -Smear with normal platelets  #Mild Hypomagnesemia -Monitor I&O's / urinary output -Follow BMP -Ensure adequate renal perfusion -Avoid nephrotoxic agents as able -Replace electrolytes as indicated ~ Pharmacy following for assistance with electrolyte replacement     Best Practice  (right click and Reselect all SmartList Selections daily)   Diet/type: NPO, start tube feeds DVT prophylaxis: SCD, Heparin  SQ GI prophylaxis: PPI Lines: N/A Foley:  yes, and is still needed Code Status:  full code Last date of multidisciplinary goals of care discussion [8/15]  8/15: Pt's mother updated at bedside on plan of care.  Labs   CBC: Recent Labs  Lab 01/17/24 1936 01/22/24 0101  WBC 4.7 6.1  NEUTROABS  --  3.6  HGB 16.1 12.2*  HCT 47.4 34.8*  MCV 103.7* 102.7*  PLT 127* 71*    Basic Metabolic Panel: Recent Labs  Lab 01/17/24 1936 01/22/24 0101  NA 138 135  K 3.6 4.2  CL 100 100  CO2 25 24  GLUCOSE 132* 94  BUN 6* 10  CREATININE 0.72 0.88  CALCIUM 8.4* 8.7*  MG  --  1.6*   GFR: Estimated Creatinine Clearance: 81 mL/min (by C-G formula based on SCr of 0.88 mg/dL). Recent Labs  Lab 01/17/24 1936 01/22/24 0101  WBC 4.7 6.1    Liver Function Tests: Recent Labs  Lab 01/17/24 1936 01/22/24 0101  AST 95* 53*  ALT 57* 39  ALKPHOS 56 41  BILITOT 0.8 0.9  PROT 7.6 6.3*  ALBUMIN 4.0 3.5   No results for input(s): LIPASE, AMYLASE in the last 168 hours. No results for input(s): AMMONIA in the last 168 hours.  ABG No results found for: PHART, PCO2ART, PO2ART, HCO3, TCO2, ACIDBASEDEF,  O2SAT   Coagulation Profile: No results for input(s): INR, PROTIME in the last 168 hours.  Cardiac Enzymes: No results for input(s): CKTOTAL, CKMB, CKMBINDEX, TROPONINI in the last 168 hours.  HbA1C: No results found for: HGBA1C  CBG: No results for input(s): GLUCAP in the last 168 hours.  Review of Systems:   Unable to assess due to intubation/sedation/AMS   Past Medical History:  He,  has a past medical history of Abnormal LFTs (liver function tests), Anxiety, Colon polyps, Erectile dysfunction, Hyperlipidemia, Hypertension, Insomnia, Microalbuminuria, and Prostatitis.   Surgical History:   Past Surgical History:   Procedure Laterality Date   COLONOSCOPY  08/23/2013   COLONOSCOPY WITH PROPOFOL  N/A 02/16/2019   Procedure: COLONOSCOPY WITH PROPOFOL ;  Surgeon: Toledo, Ladell POUR, MD;  Location: [facility] ENDOSCOPY;  Service: Gastroenterology;  Laterality: N/A;   HEMORRHOID SURGERY       Social History:   reports that he has never smoked. He has never used smokeless tobacco. He reports current alcohol  use of about 16.0 standard drinks of alcohol  per week. He reports that he does not use drugs.   Family History:  His family history is not on file.   Allergies Allergies  Allergen Reactions   Chlorothiazide Other (See Comments)    dysuria   Elemental Sulfur Other (See Comments)    Unknown reaction   Buspirone Other (See Comments)    Ankle swelling   Doxazosin Other (See Comments)    Aggression   Hydralazine Other (See Comments)    tachycardia   Losartan Other (See Comments)    tremor   Misc. Sulfonamide Containing Compounds Other (See Comments)   Sulfa Antibiotics Other (See Comments)     Home Medications  Prior to Admission medications   Medication Sig Start Date End Date Taking? Authorizing Provider  Nebivolol  HCl (BYSTOLIC  PO) Take 20 mg by mouth daily.   Yes [provider]  tadalafil (CIALIS) 20 MG tablet Take 20 mg by mouth daily as needed.   Yes [provider]     Critical care time: 60 minutes     Inge Lecher, AGACNP-BC [facility] Prefer epic messenger for cross cover needs If after hours, please call E-link

## 2024-01-22 NOTE — ED Notes (Signed)
 ETT placed 7.5 and 25 @ Lip  Positive color change noted

## 2024-01-22 NOTE — ED Notes (Signed)
 Pt woke up and was trying to climb out of bed, pulling at wires, his bed is visibly soaked. Pt unable to reorient. Verbally aggressive to staff.smith, MD aware. See [facility]

## 2024-01-22 NOTE — Assessment & Plan Note (Signed)
 Normotensive to soft so we will hold off antihypertensives

## 2024-01-22 NOTE — H&P (Signed)
 History and Physical    Patient: Ryan Rivers FMW:[id number] DOB: 16-Jun-1960 DOA: 01/22/2024 DOS: the patient was seen and examined on 01/22/2024 PCP: Rudolpho Norleen JONETTA, MD  Patient coming from: Home  Chief Complaint:  Chief Complaint  Patient presents with   Hallucinations    HPI: Ryan Rivers is a 63 y.o. male with medical history significant for HTN, alcohol  use disorder (up to 12 drinks daily ) being admitted for acute alcohol  withdrawal delirium, [facility]  Patient was seen in the ED on 8/10 after his family brought him in for excessive drinking.  Workup showed double-digit transaminitis.  He followed up with his PCP the following day and was referred to [facility] for detox.  His last drink was 8/10.  Was started on phenobarbital  on 8/12 however on the night of 8/14 he started hallucinating and was sent to the ED. In the ED, vitals within normal limits.  Previous transaminitis had improved to AST/ALT of 53/39 and CBC showed a new anemia of 12(down from 16) with macrocytosis.  Magnesium  slightly down at 1.6. CT head was nonacute Patient was treated initially with phenobarbital  IV 130 mg with a repeat dose however he continued to hallucinate, becoming agitated whereupon he was given IV midazolam  and subsequently diazepam  with some improvement.   He became hypoxic to 85% following sedation and was placed on O2 at 2 L Admission requested At the time of my evaluation, patient was somnolent, somewhat difficult to arouse due to prior sedation, protecting airway well, unable to participate.     Past Medical History:  Diagnosis Date   Abnormal LFTs (liver function tests)    Anxiety    Colon polyps    Erectile dysfunction    Hyperlipidemia    Hypertension    Insomnia    Microalbuminuria    Prostatitis    Past Surgical History:  Procedure Laterality Date   COLONOSCOPY  08/23/2013   COLONOSCOPY WITH PROPOFOL  N/A 02/16/2019   Procedure:  COLONOSCOPY WITH PROPOFOL ;  Surgeon: Toledo, Ladell POUR, MD;  Location: [facility] ENDOSCOPY;  Service: Gastroenterology;  Laterality: N/A;   HEMORRHOID SURGERY     Social History:  reports that he has never smoked. He has never used smokeless tobacco. He reports current alcohol  use of about 16.0 standard drinks of alcohol  per week. He reports that he does not use drugs.  Allergies  Allergen Reactions   Elemental Sulfur Other (See Comments)   Sulfa Antibiotics Other (See Comments)    History reviewed. No pertinent family history.  Prior to Admission medications   Medication Sig Start Date End Date Taking? Authorizing Provider  Nebivolol  HCl (BYSTOLIC  PO) Take 20 mg by mouth daily.    [provider]    Physical Exam: Vitals:   01/22/24 0049 01/22/24 0330 01/22/24 0335 01/22/24 0430  BP:  106/68  112/70  Pulse:  88 80 77  Resp:  (!) 23 20 17   Temp:    97.8 F (36.6 C)  TempSrc:    Axillary  SpO2:  (!) 85% 96% 99%  Height: 5' 8 (1.727 m)      Physical Exam Vitals and nursing note reviewed.  Constitutional:      General: He is not in acute distress.    Comments: Somnolent difficult to arouse.  No distress  HENT:     Head: Normocephalic and atraumatic.  Cardiovascular:     Rate and Rhythm: Normal rate and regular rhythm.     Heart  sounds: Normal heart sounds.  Pulmonary:     Effort: Pulmonary effort is normal.     Breath sounds: Normal breath sounds.  Abdominal:     Palpations: Abdomen is soft.     Tenderness: There is no abdominal tenderness.  Neurological:     General: No focal deficit present.     Labs on Admission: I have personally reviewed following labs and imaging studies  CBC: Recent Labs  Lab 01/17/24 1936 01/22/24 0101  WBC 4.7 6.1  NEUTROABS  --  3.6  HGB 16.1 12.2*  HCT 47.4 34.8*  MCV 103.7* 102.7*  PLT 127* 71*   Basic Metabolic Panel: Recent Labs  Lab 01/17/24 1936 01/22/24 0101  NA 138 135  K 3.6 4.2  CL 100 100  CO2 25 24   GLUCOSE 132* 94  BUN 6* 10  CREATININE 0.72 0.88  CALCIUM 8.4* 8.7*  MG  --  1.6*   GFR: CrCl cannot be calculated (Unknown ideal weight.). Liver Function Tests: Recent Labs  Lab 01/17/24 1936 01/22/24 0101  AST 95* 53*  ALT 57* 39  ALKPHOS 56 41  BILITOT 0.8 0.9  PROT 7.6 6.3*  ALBUMIN 4.0 3.5   No results for input(s): LIPASE, AMYLASE in the last 168 hours. No results for input(s): AMMONIA in the last 168 hours. Coagulation Profile: No results for input(s): INR, PROTIME in the last 168 hours. Cardiac Enzymes: No results for input(s): CKTOTAL, CKMB, CKMBINDEX, TROPONINI in the last 168 hours. BNP (last 3 results) No results for input(s): PROBNP in the last 8760 hours. HbA1C: No results for input(s): HGBA1C in the last 72 hours. CBG: No results for input(s): GLUCAP in the last 168 hours. Lipid Profile: No results for input(s): CHOL, HDL, LDLCALC, TRIG, CHOLHDL, LDLDIRECT in the last 72 hours. Thyroid Function Tests: No results for input(s): TSH, T4TOTAL, FREET4, T3FREE, THYROIDAB in the last 72 hours. Anemia Panel: No results for input(s): VITAMINB12, FOLATE, FERRITIN, TIBC, IRON, RETICCTPCT in the last 72 hours. Urine analysis:    Component Value Date/Time   COLORURINE COLORLESS (A) 01/22/2024 0101   APPEARANCEUR CLEAR (A) 01/22/2024 0101   APPEARANCEUR Hazy 08/15/2011 1731   LABSPEC 1.001 (L) 01/22/2024 0101   LABSPEC 1.006 08/15/2011 1731   PHURINE 7.0 01/22/2024 0101   GLUCOSEU NEGATIVE 01/22/2024 0101   GLUCOSEU Negative 08/15/2011 1731   HGBUR NEGATIVE 01/22/2024 0101   BILIRUBINUR NEGATIVE 01/22/2024 0101   BILIRUBINUR Negative 08/15/2011 1731   KETONESUR NEGATIVE 01/22/2024 0101   PROTEINUR NEGATIVE 01/22/2024 0101   NITRITE NEGATIVE 01/22/2024 0101   LEUKOCYTESUR NEGATIVE 01/22/2024 0101   LEUKOCYTESUR Negative 08/15/2011 1731    Radiological Exams on Admission: CT HEAD WO CONTRAST  ( ) Result Date: 01/22/2024 CLINICAL DATA:  Initial evaluation for acute altered mental status. EXAM: CT HEAD WITHOUT CONTRAST TECHNIQUE: Contiguous axial images were obtained from the base of the skull through the vertex without intravenous contrast. RADIATION DOSE REDUCTION: This exam was performed according to the departmental dose-optimization program which includes automated exposure control, adjustment of the mA and/or kV according to patient size and/or use of iterative reconstruction technique. COMPARISON:  CT from 11/24/2018. FINDINGS: Brain: Cerebral volume within normal limits. Mild chronic microvascular ischemic disease. No acute intracranial hemorrhage. No acute large vessel territory infarct. No mass lesion or midline shift. No hydrocephalus or extra-axial fluid collection. Vascular: No abnormal hyperdense vessel. Calcified atherosclerosis present at skull base with no made of a focal calcified plaque in the right M1 segment. Skull: Scalp soft tissues within  normal limits.  Calvarium intact. Sinuses/Orbits: Left gaze noted. Globes orbital soft tissues otherwise unremarkable. Mild scattered mucosal thickening present about the ethmoidal air cells. Small right sphenoid sinus retention cyst. Paranasal sinuses are otherwise clear. No mastoid effusion. Other: None. IMPRESSION: 1. No acute intracranial abnormality. 2. Mild chronic microvascular ischemic disease. Electronically Signed   By: Morene Hoard M.D.   On: 01/22/2024 03:35   Data Reviewed for HPI: Relevant notes from primary care and specialist visits, past discharge summaries as available in EHR, including Care Everywhere. Prior diagnostic testing as pertinent to current admission diagnoses Updated medications and problem lists for reconciliation ED course, including vitals, labs, imaging, treatment and response to treatment Triage notes, nursing and pharmacy notes and ED provider's notes Notable results as noted above in  HPI      Assessment and Plan: * Alcohol  withdrawal delirium, acute, hyperactive (HCC) CIWA withdrawal protocol with Ativan  Thiamine  folate IV Close monitoring in PCU  Anemia New anemia of 12, down from 16 on 8/10 Anemia panel Monitor H&H  Hypoxia, related to sedation Continue O2 at 2 L Patient is currently protecting airway Aspiration precautions Will keep n.p.o. until more fully awake  HTN (hypertension) Normotensive to soft so we will hold off antihypertensives     DVT prophylaxis: Lovenox   Consults: none  Advance Care Planning: full code  Family Communication: none  Disposition Plan: Back to previous home environment  Severity of Illness: The appropriate patient status for this patient is OBSERVATION. Observation status is judged to be reasonable and necessary in order to provide the required intensity of service to ensure the patient's safety. The patient's presenting symptoms, physical exam findings, and initial radiographic and laboratory data in the context of their medical condition is felt to place them at decreased risk for further clinical deterioration. Furthermore, it is anticipated that the patient will be medically stable for discharge from the hospital within 2 midnights of admission.   Author: Delayne LULLA Solian, MD 01/22/2024 4:37 AM  For on call review [email or web address].

## 2024-01-22 NOTE — Progress Notes (Signed)
 Hypoglycemic Event  CBG: 61  Treatment: D50 25 mL (12.5 gm)  Symptoms: None  Follow-up CBG: Time:1648 CBG Result:131   Possible Reasons for Event: Inadequate meal intake  Comments/MD notified:Jeremiah NP    Ryan Rivers

## 2024-01-22 NOTE — Assessment & Plan Note (Signed)
 Continue O2 at 2 L Patient is currently protecting airway Aspiration precautions Will keep n.p.o. until more fully awake

## 2024-01-22 NOTE — ED Triage Notes (Signed)
 Pt arrives w/ [facility] for alcohol  detox. Pt reports stopping drinking alcohol  Sunday. Been at the detox center for 2 days. Today pt started hallucinating that [facility] was in [facility] so  pt was sent over here. Pt alert & oriented upon arrival.

## 2024-01-22 NOTE — Progress Notes (Signed)
 Initial Nutrition Assessment  DOCUMENTATION CODES:   Not applicable  INTERVENTION:   -TF via OGT:   Initiate Vital AF 1.2 @ 20 ml/hr and increase by 10 ml every 4 hours to goal rate of 60 ml/hr.   30 ml free water  flush every 4 hours  Tube feeding regimen provides 1728 kcal (100% of needs), 108 grams of protein, and 1168 ml of H2O.  Total free water : 1348 ml daily  -Continue MVI with minerals daily via tube -Continue 1 mg folic acid  daily via tube -Continue 100 mg thiamine  daily via tube -Monitor Mg, K, and Phos and replete as needed secondary to high refeeding risk   NUTRITION DIAGNOSIS:   Inadequate oral intake related to inability to eat as evidenced by NPO status.  GOAL:   Patient will meet greater than or equal to 90% of their needs  MONITOR:   Vent status, TF tolerance  REASON FOR ASSESSMENT:   Ventilator    ASSESSMENT:   Pt with medical history significant for HTN, alcohol  use disorder (up to 12 drinks daily ) being admitted for acute alcohol  withdrawal delirium, failing outpatient therapy  Pt admitted with alcohol  withdrawal and delirium.   Patient is currently intubated on ventilator support. OGT currently clamped; tip of tube confirmed in stomach.  MV: 7.0 L/min Temp (24hrs), Avg:97.6 F (36.4 C), Min:97.1 F (36.2 C), Max:98.3 F (36.8 C)  Propofol : 21.1 ml/hr (provides 557 kcals daily)   Reviewed I/O's: -1.2 L x 24 hours  UOP: 1.2 L x 24 hours  Per H&P, pt brought into ED secondary to excessive drinking on 01/17/24, He followed up with PCP on 01/18/24 and was referred to [facility] (last drink was 01/17/24). He was started on phenobarbital  on 01/19/24, however, brought to the ED on 01/21/24 secondary to hallucinations.   Case discussed with RN, MD, and ICU rounds. Pt was intubated in ED secondary to agitation. No plans to extubate today, but may do wake up assessment tomorrow. RN reports concern over hypoglycemia. RD received permission to start TF. Noted  concern for refeeding risk; MVI, thiamine , and folic acid  ordered secondary to ETOH abuse. Pharmacy to monitor electrolytes.   Reviewed wt hx; no wt loss noted.   Medications reviewed and include folic acid , protonix , miralax , and thiamine .   Labs reviewed: CBGS: 65-74. (inpatient orders for glycemic control are none).    NUTRITION - FOCUSED PHYSICAL EXAM:  Flowsheet Row Most Recent Value  Orbital Region No depletion  Upper Arm Region No depletion  Thoracic and Lumbar Region No depletion  Buccal Region No depletion  Temple Region No depletion  Clavicle Bone Region No depletion  Clavicle and Acromion Bone Region No depletion  Scapular Bone Region No depletion  Dorsal Hand No depletion  Patellar Region Mild depletion  Anterior Thigh Region Mild depletion  Posterior Calf Region Mild depletion  Edema (RD Assessment) None  Hair Reviewed  Eyes Reviewed  Mouth Reviewed  Nails Reviewed    Diet Order:   Diet Order             Diet NPO time specified  Diet effective now                   EDUCATION NEEDS:   Not appropriate for education at this time  Skin:  Skin Assessment: Reviewed RN Assessment  Last BM:  Unknown  Height:   Ht Readings from Last 1 Encounters:  01/22/24 5' 8 (1.727 m)    Weight:   Wt Readings  from Last 1 Encounters:  01/22/24 75.1 kg    Ideal Body Weight:  70 kg  BMI:  Body mass index is 25.17 kg/m.  Estimated Nutritional Needs:   Kcal:  1605  Protein:  100-115 grams  Fluid:  1.6-1.8 L    Margery ORN, RD, LDN, CDCES Registered Dietitian III Certified Diabetes Care and Education Specialist If unable to reach this RD, please use RD Inpatient group chat on secure chat between hours of 8am-4 pm daily

## 2024-01-22 NOTE — Assessment & Plan Note (Addendum)
 New anemia of 12, down from 16 on 8/10 Anemia panel Monitor H&H

## 2024-01-22 NOTE — Consult Note (Signed)
 PHARMACY CONSULT NOTE - ELECTROLYTES  Pharmacy Consult for Electrolyte Monitoring and Replacement   Recent Labs: Height: 5' 8 (172.7 cm) Weight: 70.8 kg (156 lb 1.4 oz) (wt from OF 8/11) IBW/kg (Calculated) : 68.4 Estimated Creatinine Clearance: 81 mL/min (by C-G formula based on SCr of 0.88 mg/dL). Potassium (mmol/L)  Date Value  01/22/2024 4.2  08/15/2011 3.2 (L)   Magnesium  (mg/dL)  Date Value  91/84/7974 1.6 (L)   Calcium (mg/dL)  Date Value  91/84/7974 8.7 (L)   Calcium, Total (mg/dL)  Date Value  96/91/7986 8.7   Albumin (g/dL)  Date Value  91/84/7974 3.5  08/15/2011 4.3   Sodium (mmol/L)  Date Value  01/22/2024 135  08/15/2011 139    Assessment  Ryan Rivers is a 63 y.o. male presenting with acute metabolic encephalopathy. PMH significant for anxiety, HLD, HTN. Pharmacy has been consulted to monitor and replace electrolytes.  Diet: NPO MIVF: NS @ 75 mL/hr Pertinent medications: norepinephrine  gtt  Goal of Therapy: Electrolytes WNL  Plan:  Mag 1.6: Mag sulfate 4g IV x 1 Check BMP, Mg, Phos with AM labs  Thank you for allowing pharmacy to be a part of this patient's care.  Rendy Lazard A Jahnay Lantier, PharmD Clinical Pharmacist 01/22/2024 7:48 AM

## 2024-01-22 NOTE — ED Notes (Signed)
 Warm blankets provided to pt.

## 2024-01-22 NOTE — ED Notes (Addendum)
 100 mcg Propofol  given by Dr Claudene from bottle

## 2024-01-22 NOTE — ED Notes (Signed)
 Pt hallucinating that people are trying to break into his home, reorientation attempts are unsuccessful. Pt becoming irritated and threatening to pull out his IV and monitoring wires.   IV was removed by float RN due to red streaking p medicine administration. Pharmacy contacted and advised us  to remove IV and apply warm compress.   Claudene, MD notified.   Float RN starting new IV

## 2024-01-22 NOTE — Assessment & Plan Note (Addendum)
 CIWA withdrawal protocol with Ativan  Thiamine  folate IV Close monitoring in PCU

## 2024-01-23 ENCOUNTER — Inpatient Hospital Stay

## 2024-01-23 LAB — GLUCOSE, CAPILLARY
Glucose-Capillary: 108 mg/dL — ABNORMAL HIGH (ref 70–99)
Glucose-Capillary: 109 mg/dL — ABNORMAL HIGH (ref 70–99)
Glucose-Capillary: 120 mg/dL — ABNORMAL HIGH (ref 70–99)
Glucose-Capillary: 141 mg/dL — ABNORMAL HIGH (ref 70–99)
Glucose-Capillary: 144 mg/dL — ABNORMAL HIGH (ref 70–99)
Glucose-Capillary: 98 mg/dL (ref 70–99)

## 2024-01-23 LAB — CBC
HCT: 34.8 % — ABNORMAL LOW (ref 39.0–52.0)
Hemoglobin: 12 g/dL — ABNORMAL LOW (ref 13.0–17.0)
MCH: 36.3 pg — ABNORMAL HIGH (ref 26.0–34.0)
MCHC: 34.5 g/dL (ref 30.0–36.0)
MCV: 105.1 fL — ABNORMAL HIGH (ref 80.0–100.0)
Platelets: 83 K/uL — ABNORMAL LOW (ref 150–400)
RBC: 3.31 MIL/uL — ABNORMAL LOW (ref 4.22–5.81)
RDW: 12.4 % (ref 11.5–15.5)
WBC: 9.5 K/uL (ref 4.0–10.5)
nRBC: 0 % (ref 0.0–0.2)

## 2024-01-23 LAB — MAGNESIUM: Magnesium: 2 mg/dL (ref 1.7–2.4)

## 2024-01-23 LAB — TRIGLYCERIDES: Triglycerides: 93 mg/dL (ref <150)

## 2024-01-23 LAB — RENAL FUNCTION PANEL
Albumin: 2.7 g/dL — ABNORMAL LOW (ref 3.5–5.0)
Anion gap: 11 (ref 5–15)
BUN: 6 mg/dL — ABNORMAL LOW (ref 8–23)
CO2: 22 mmol/L (ref 22–32)
Calcium: 7.7 mg/dL — ABNORMAL LOW (ref 8.9–10.3)
Chloride: 103 mmol/L (ref 98–111)
Creatinine, Ser: 0.87 mg/dL (ref 0.61–1.24)
GFR, Estimated: >60 mL/min (ref >60)
Glucose, Bld: 120 mg/dL — ABNORMAL HIGH (ref 70–99)
Phosphorus: 3.6 mg/dL (ref 2.5–4.6)
Potassium: 3.2 mmol/L — ABNORMAL LOW (ref 3.5–5.1)
Sodium: 136 mmol/L (ref 135–145)

## 2024-01-23 LAB — BLOOD GAS, ARTERIAL
Acid-Base Excess: 2.9 mmol/L — ABNORMAL HIGH (ref 0.0–2.0)
Bicarbonate: 27.9 mmol/L (ref 20.0–28.0)
FIO2: 24 %
MECHVT: 500 mL
Mechanical Rate: 14
O2 Saturation: 98.4 %
PEEP: 5 cmH2O
Patient temperature: 37
pCO2 arterial: 43 mmHg (ref 32–48)
pH, Arterial: 7.42 (ref 7.35–7.45)
pO2, Arterial: 82 mmHg — ABNORMAL LOW (ref 83–108)

## 2024-01-23 LAB — POTASSIUM: Potassium: 3.5 mmol/L (ref 3.5–5.1)

## 2024-01-23 MED ORDER — DEXTROSE IN LACTATED RINGERS 5 % IV SOLN: INTRAVENOUS | Status: DC

## 2024-01-23 MED ORDER — POTASSIUM CHLORIDE 10 MEQ/100ML IV SOLN
10.0000 meq | INTRAVENOUS | Status: AC
  Administered 2024-01-23 (×2): 10 meq via INTRAVENOUS
  Filled 2024-01-23 (×2): qty 100

## 2024-01-23 MED ORDER — DEXMEDETOMIDINE HCL IN NACL 400 MCG/100ML IV SOLN
0.0000 ug/kg/h | INTRAVENOUS | Status: DC
  Administered 2024-01-23 (×2): 1.2 ug/kg/h via INTRAVENOUS
  Administered 2024-01-23: 1.1 ug/kg/h via INTRAVENOUS
  Administered 2024-01-23: 0.4 ug/kg/h via INTRAVENOUS
  Administered 2024-01-24: 1 ug/kg/h via INTRAVENOUS
  Administered 2024-01-24 (×2): 1.2 ug/kg/h via INTRAVENOUS
  Administered 2024-01-24 – 2024-01-25 (×2): 0.5 ug/kg/h via INTRAVENOUS
  Administered 2024-01-25: 0.9 ug/kg/h via INTRAVENOUS
  Filled 2024-01-23 (×9): qty 100

## 2024-01-23 MED ORDER — MAGNESIUM SULFATE 2 GM/50ML IV SOLN
2.0000 g | Freq: Once | INTRAVENOUS | Status: DC
  Filled 2024-01-23: qty 50

## 2024-01-23 MED ORDER — SODIUM CHLORIDE 0.9 % IV SOLN: 250.0000 mL | INTRAVENOUS | Status: AC

## 2024-01-23 MED ORDER — DEXMEDETOMIDINE HCL IN NACL 400 MCG/100ML IV SOLN
INTRAVENOUS | Status: AC
Start: 2024-01-23 — End: 2024-01-23
  Filled 2024-01-23: qty 100

## 2024-01-23 NOTE — Care Management Important Message (Signed)
 Important Message  Patient Details  Name: Ryan Rivers MRN: [id number] Date of Birth: [birth year]   Important Message Given:  Yes - Medicare IM     [name] 01/23/2024, 12:57 PM

## 2024-01-23 NOTE — Consult Note (Signed)
 PHARMACY CONSULT NOTE - ELECTROLYTES  Pharmacy Consult for Electrolyte Monitoring and Replacement   Recent Labs: Height: 5' 8 (172.7 cm) Weight: 75.1 kg (165 lb 9.1 oz) IBW/kg (Calculated) : 68.4 Estimated Creatinine Clearance: 81.9 mL/min (by C-G formula based on SCr of 0.87 mg/dL). Potassium (mmol/L)  Date Value  01/23/2024 3.2 (L)  08/15/2011 3.2 (L)   Magnesium  (mg/dL)  Date Value  91/83/7974 2.0   Calcium (mg/dL)  Date Value  91/83/7974 7.7 (L)   Calcium, Total (mg/dL)  Date Value  96/91/7986 8.7   Albumin (g/dL)  Date Value  91/83/7974 2.7 (L)  08/15/2011 4.3   Phosphorus (mg/dL)  Date Value  91/83/7974 3.6   Sodium (mmol/L)  Date Value  01/23/2024 136  08/15/2011 139    Assessment  Ryan Rivers is a 63 y.o. male presenting with acute metabolic encephalopathy. PMH significant for anxiety, HLD, HTN. Pharmacy has been consulted to monitor and replace electrolytes.  Goal of Therapy: Electrolytes WNL  Plan:  10 mEq IV KCl x 2 Check BMP, Mg, Phos with AM labs  Thank you for allowing pharmacy to be a part of this patient's care.  Adriana JONETTA Bolster, PharmD Clinical Pharmacist 01/23/2024 7:38 AM

## 2024-01-23 NOTE — Progress Notes (Signed)
 NAME:  Ryan Rivers, MRN:  [id number], DOB:  Jan 22, 1961, LOS: 1 ADMISSION DATE:  01/22/2024, CONSULTATION DATE:  01/22/2024 REFERRING MD:  Dr Claudene, CHIEF COMPLAINT:  Alcohol  Withdrawal    Brief Pt Description / Synopsis:  63 y.o. male admitted with Acute Metabolic Encephalopathy in the setting of severe Alcohol  Withdrawal with DT's requiring intubation and mechanical ventilation.  History of Present Illness:  Ryan Rivers is a 63 y.o. male with a past medical history significant for alcohol  abuse (up to 12 drinks daily) and hypertension who presented to [facility] ED on 01/22/24 for evaluation of hallucinations and altered mental status.  Patient is currently intubated and unable to contribute to history, therefore history is obtained from chart review.   The patient has been at the [facility] for alcohol  detox for the past 2 days.  He reported his last drink was on Sunday 01/17/24. Patient was seen in the ED on 8/10 after his family brought him in for excessive drinking. Workup showed double-digit transaminitis. He followed up with his PCP the following day and was referred to [facility] for detox.  He was being treated with Phenobarbital , however last night 8/14 started having hallucinations.   ED Course: Initial Vital Signs: Temperature 98.3 F, pulse 77, RR 16, BP 130/69, SpO2 98% Significant Labs: Magnesium  1.6, AST 53, Hgb 12.2, Hct 34.8, MCV 102.7, MCH 36, platelets 71 UA negative for UTI UDS + for barbiturates Imaging Chest X-ray>>IMPRESSION: 1. Satisfactory ET tube and enteric tube placement. 2. Lower lung volumes. No acute cardiopulmonary abnormality. CT Head wo contrast>>IMPRESSION: 1. No acute intracranial abnormality. 2. Mild chronic microvascular ischemic disease. Medications Administered:  phenobarbital  IV 130 mg with a repeat dose however he continued to hallucinate, becoming agitated whereupon he was given IV midazolam  and subsequently diazepam  with some improvement.     After sedation, he became hypoxic with O2 sats of 85% of which he was placed on 2L nasal cannula, reported to be protecting his airway.  TRH was planning to admit,  however he became extremely combative and agressive with medical staff.  Subsequently required intubation and mechanical ventilation for both patient and staff safety.   PCCM asked to admit for further workup and treatment.  Please see Significant Hospital Events section below for full detailed hospital course.  01/23/24- patient had SBT but failed due to severe aggitation and tachyarrythmia upon neuroawakening.   Pertinent  Medical History   Past Medical History:  Diagnosis Date   Abnormal LFTs (liver function tests)    Anxiety    Colon polyps    Erectile dysfunction    Hyperlipidemia    Hypertension    Insomnia    Microalbuminuria    Prostatitis     Micro Data:  8/15: MRSA PCR>> negative  Antimicrobials:   Anti-infectives (From admission, onward)    None       Significant Hospital Events: Including procedures, antibiotic start and stop dates in addition to other pertinent events   8/15: Presented to ED with hallucinations and severe ETOH withdrawal and DT's.  DT's worsened despite phenobarbital  and benzos, became very combative and aggressive with staff ultimately requiring intubation in the ED.  PCCM asked to admit.   Interim History / Subjective:  As outlined above under Significant Hospital Events section  Objective   Blood pressure (!) 100/55, pulse 94, temperature (!) 101.8 F (38.8 C), temperature source Oral, resp. rate 10, height 5' 8 (1.727 m), weight 75.1 kg, SpO2 99%.    Vent Mode:  PSV FiO2 (%):  [24 %-40 %] 24 % Set Rate:  [14 bmp] 14 bmp Vt Set:  [500 mL] 500 mL PEEP:  [5 cmH20] 5 cmH20 Pressure Support:  [5 cmH20] 5 cmH20 Plateau Pressure:  [15 cmH20] 15 cmH20   Intake/Output Summary (Last 24 hours) at 01/23/2024 9157 Last data filed at 01/23/2024 9241 Gross per 24 hour   Intake 3233.41 ml  Output 760 ml  Net 2473.41 ml   Filed Weights   01/22/24 0600 01/22/24 0944  Weight: 70.8 kg 75.1 kg    Examination: General: Critically ill appearing male, laying in bed, intubated and sedated, in NAD HENT: Atraumatic, normocephalic, neck supple, no JVD, orally intubated Lungs: Mechanical breath sounds throughout, even, nonlabored, synchronous with the vent Cardiovascular: Regular rate and rhythm, S1-S2, no murmurs, rubs, gallops Abdomen: Soft, nontender, nondistended, no guarding rebound tenderness, bowel sounds positive x 4 Extremities: Normal bulk and tone, no deformities, no edema, no cyanosis Neuro: Sedated, currently does not withdraw from pain or follow commands, pupils PERRLA GU: Foley catheter being placed  Resolved Hospital Problem list     Assessment & Plan:   #Acute Metabolic Encephalopathy #Severe Alcohol  Withdrawal with Delirium Tremens #Sedation needs in setting of mechanical ventilation -CT Head on presentation negative for acute intracranial abnormality  -Maintain a RASS goal of 0 to -1 -Fentanyl  and Propofol  to maintain RASS goal -Avoid sedating medications as able -Daily wake up assessment -CIWA protocol -Start Librium  taper  -High dose thiamine  x3 days, followed by 100 mg daily -Folate and MVI  #Intubated for airway protection and patient/staff safety #Acute Hypoxic Respiratory Failure -Full vent support, implement lung protective strategies -Plateau pressures less than 30 cm H20 -Wean FiO2 & PEEP as tolerated to maintain O2 sats >92% -Follow intermittent Chest X-ray & ABG as needed -Spontaneous Breathing Trials when respiratory parameters met and mental status permits -Implement VAP Bundle -Prn Bronchodilators  #Thrombocytopenia, suspect due to ETOH abuse #Anemia without overt s/sx of bleeding -Monitor for S/Sx of bleeding -Trend CBC -Heparin  SQ for VTE Prophylaxis (will do 5000 units BID dosing due to  thrombocytopenia) -Transfuse for Hgb <7 -Smear with normal platelets  #Mild Hypomagnesemia -Monitor I&O's / urinary output -Follow BMP -Ensure adequate renal perfusion -Avoid nephrotoxic agents as able -Replace electrolytes as indicated ~ Pharmacy following for assistance with electrolyte replacement     Best Practice (right click and Reselect all SmartList Selections daily)   Diet/type: NPO, start tube feeds DVT prophylaxis: SCD, Heparin  SQ GI prophylaxis: PPI Lines: N/A Foley:  yes, and is still needed Code Status:  full code Last date of multidisciplinary goals of care discussion [8/15]  8/16: Pt's mother and daughter updated at bedside on plan of care.  Labs   CBC: Recent Labs  Lab 01/17/24 1936 01/22/24 0101 01/22/24 0831 01/23/24 0625  WBC 4.7 6.1  --  9.5  NEUTROABS  --  3.6  --   --   HGB 16.1 12.2* 12.8* 12.0*  HCT 47.4 34.8*  --  34.8*  MCV 103.7* 102.7*  --  105.1*  PLT 127* 71*  --  83*    Basic Metabolic Panel: Recent Labs  Lab 01/17/24 1936 01/22/24 0101 01/23/24 0625  NA 138 135 136  K 3.6 4.2 3.2*  CL 100 100 103  CO2 25 24 22   GLUCOSE 132* 94 120*  BUN 6* 10 6*  CREATININE 0.72 0.88 0.87  CALCIUM 8.4* 8.7* 7.7*  MG  --  1.6* 2.0  PHOS  --   --  3.6   GFR: Estimated Creatinine Clearance: 81.9 mL/min (by C-G formula based on SCr of 0.87 mg/dL). Recent Labs  Lab 01/17/24 1936 01/22/24 0101 01/23/24 0625  WBC 4.7 6.1 9.5    Liver Function Tests: Recent Labs  Lab 01/17/24 1936 01/22/24 0101 01/23/24 0625  AST 95* 53*  --   ALT 57* 39  --   ALKPHOS 56 41  --   BILITOT 0.8 0.9  --   PROT 7.6 6.3*  --   ALBUMIN 4.0 3.5 2.7*   No results for input(s): LIPASE, AMYLASE in the last 168 hours. No results for input(s): AMMONIA in the last 168 hours.  ABG    Component Value Date/Time   PHART 7.42 01/23/2024 0521   PCO2ART 43 01/23/2024 0521   PO2ART 82 (L) 01/23/2024 0521   HCO3 27.9 01/23/2024 0521   O2SAT 98.4  01/23/2024 0521     Coagulation Profile: No results for input(s): INR, PROTIME in the last 168 hours.  Cardiac Enzymes: No results for input(s): CKTOTAL, CKMB, CKMBINDEX, TROPONINI in the last 168 hours.  HbA1C: No results found for: HGBA1C  CBG: Recent Labs  Lab 01/22/24 1626 01/22/24 1648 01/22/24 1940 01/23/24 0007 01/23/24 0756  GLUCAP 61* 131* 113* 120* 98    Review of Systems:   Unable to assess due to intubation/sedation/AMS   Past Medical History:  He,  has a past medical history of Abnormal LFTs (liver function tests), Anxiety, Colon polyps, Erectile dysfunction, Hyperlipidemia, Hypertension, Insomnia, Microalbuminuria, and Prostatitis.   Surgical History:   Past Surgical History:  Procedure Laterality Date   COLONOSCOPY  08/23/2013   COLONOSCOPY WITH PROPOFOL  N/A 02/16/2019   Procedure: COLONOSCOPY WITH PROPOFOL ;  Surgeon: Toledo, Ladell POUR, MD;  Location: [facility] ENDOSCOPY;  Service: Gastroenterology;  Laterality: N/A;   HEMORRHOID SURGERY       Social History:   reports that he has never smoked. He has never used smokeless tobacco. He reports current alcohol  use of about 16.0 standard drinks of alcohol  per week. He reports that he does not use drugs.   Family History:  His family history is not on file.   Allergies Allergies  Allergen Reactions   Chlorothiazide Other (See Comments)    dysuria   Elemental Sulfur Other (See Comments)    Unknown reaction   Buspirone Other (See Comments)    Ankle swelling   Doxazosin Other (See Comments)    Aggression   Hydralazine Other (See Comments)    tachycardia   Losartan Other (See Comments)    tremor   Misc. Sulfonamide Containing Compounds Other (See Comments)   Sulfa Antibiotics Other (See Comments)     Home Medications  Prior to Admission medications   Medication Sig Start Date End Date Taking? Authorizing Provider  Nebivolol  HCl (BYSTOLIC  PO) Take 20 mg by mouth daily.   Yes [provider]  tadalafil (CIALIS) 20 MG tablet Take 20 mg by mouth daily as needed.   Yes [provider]     Critical care provider statement:   Total critical care time: 62 minutes   Performed by: Parris MD   Critical care time was exclusive of separately billable procedures and treating other patients.   Critical care was necessary to treat or prevent imminent or life-threatening deterioration.   Critical care was time spent personally by me on the following activities: development of treatment plan with patient and/or surrogate as well as nursing, discussions with consultants, evaluation of patient's response to treatment, examination of  patient, obtaining history from patient or surrogate, ordering and performing treatments and interventions, ordering and review of laboratory studies, ordering and review of radiographic studies, pulse oximetry and re-evaluation of patient's condition.    Naida Escalante, M.D.  Pulmonary & Critical Care Medicine

## 2024-01-24 LAB — CBC
HCT: 36.7 % — ABNORMAL LOW (ref 39.0–52.0)
Hemoglobin: 12.6 g/dL — ABNORMAL LOW (ref 13.0–17.0)
MCH: 35.5 pg — ABNORMAL HIGH (ref 26.0–34.0)
MCHC: 34.3 g/dL (ref 30.0–36.0)
MCV: 103.4 fL — ABNORMAL HIGH (ref 80.0–100.0)
Platelets: 89 K/uL — ABNORMAL LOW (ref 150–400)
RBC: 3.55 MIL/uL — ABNORMAL LOW (ref 4.22–5.81)
RDW: 11.8 % (ref 11.5–15.5)
WBC: 6.1 K/uL (ref 4.0–10.5)
nRBC: 0 % (ref 0.0–0.2)

## 2024-01-24 LAB — GLUCOSE, CAPILLARY
Glucose-Capillary: 102 mg/dL — ABNORMAL HIGH (ref 70–99)
Glucose-Capillary: 103 mg/dL — ABNORMAL HIGH (ref 70–99)
Glucose-Capillary: 113 mg/dL — ABNORMAL HIGH (ref 70–99)
Glucose-Capillary: 115 mg/dL — ABNORMAL HIGH (ref 70–99)
Glucose-Capillary: 122 mg/dL — ABNORMAL HIGH (ref 70–99)
Glucose-Capillary: 128 mg/dL — ABNORMAL HIGH (ref 70–99)
Glucose-Capillary: 98 mg/dL (ref 70–99)

## 2024-01-24 LAB — MAGNESIUM: Magnesium: 2 mg/dL (ref 1.7–2.4)

## 2024-01-24 LAB — RENAL FUNCTION PANEL
Albumin: 2.7 g/dL — ABNORMAL LOW (ref 3.5–5.0)
Anion gap: 4 — ABNORMAL LOW (ref 5–15)
BUN: 7 mg/dL — ABNORMAL LOW (ref 8–23)
CO2: 26 mmol/L (ref 22–32)
Calcium: 7.9 mg/dL — ABNORMAL LOW (ref 8.9–10.3)
Chloride: 106 mmol/L (ref 98–111)
Creatinine, Ser: 0.51 mg/dL — ABNORMAL LOW (ref 0.61–1.24)
GFR, Estimated: >60 mL/min (ref >60)
Glucose, Bld: 125 mg/dL — ABNORMAL HIGH (ref 70–99)
Phosphorus: 2.6 mg/dL (ref 2.5–4.6)
Potassium: 3.5 mmol/L (ref 3.5–5.1)
Sodium: 136 mmol/L (ref 135–145)

## 2024-01-24 MED ORDER — HYDROXYZINE HCL 25 MG PO TABS: 25.0000 mg | ORAL_TABLET | Freq: Four times a day (QID) | ORAL | Status: DC | PRN

## 2024-01-24 MED ORDER — DIAZEPAM 5 MG/ML IJ SOLN: 2.5000 mg | Freq: Four times a day (QID) | INTRAMUSCULAR | Status: DC | PRN

## 2024-01-24 MED ORDER — FOLIC ACID 1 MG PO TABS
1.0000 mg | ORAL_TABLET | Freq: Every day | ORAL | Status: DC
  Administered 2024-01-26 – 2024-01-28 (×3): 1 mg via ORAL
  Filled 2024-01-24 (×4): qty 1

## 2024-01-24 MED ORDER — ORAL CARE MOUTH RINSE
15.0000 mL | OROMUCOSAL | Status: DC | PRN
Start: 2024-01-24 — End: 2024-01-28

## 2024-01-24 MED ORDER — DOCUSATE SODIUM 50 MG/5ML PO LIQD: 100.0000 mg | Freq: Two times a day (BID) | ORAL | Status: DC | PRN

## 2024-01-24 MED ORDER — SODIUM CHLORIDE 0.9 % IV SOLN
1.0000 mg | Freq: Every day | INTRAVENOUS | Status: DC
  Administered 2024-01-25: 1 mg via INTRAVENOUS
  Filled 2024-01-24 (×4): qty 0.2

## 2024-01-24 MED ORDER — ADULT MULTIVITAMIN W/MINERALS CH
1.0000 | ORAL_TABLET | Freq: Every day | ORAL | Status: DC
  Administered 2024-01-26 – 2024-01-28 (×3): 1 via ORAL
  Filled 2024-01-24 (×4): qty 1

## 2024-01-24 MED ORDER — FOLIC ACID 1 MG PO TABS: 1.0000 mg | ORAL_TABLET | Freq: Every day | ORAL | Status: DC

## 2024-01-24 MED ORDER — DIAZEPAM 5 MG/ML IJ SOLN
5.0000 mg | Freq: Once | INTRAMUSCULAR | Status: AC
  Administered 2024-01-24: 5 mg via INTRAVENOUS
  Filled 2024-01-24: qty 2

## 2024-01-24 MED ORDER — FOLIC ACID 5 MG/ML IJ SOLN
1.0000 mg | Freq: Once | INTRAMUSCULAR | Status: AC
  Administered 2024-01-24: 1 mg via INTRAVENOUS
  Filled 2024-01-24: qty 0.2

## 2024-01-24 NOTE — TOC CM/SW Note (Addendum)
..  Transition of Care [facility]) - Inpatient Brief Assessment   Patient Details  Name: Ryan Rivers MRN: [id number] Date of Birth: 25-Jun-1960  Transition of Care [facility]) CM/SW Contact:    Edsel DELENA Fischer, LCSW Phone Number: 01/24/2024, 3:28 PM   Clinical Narrative:  SW provided resources from [employer] [facility] and printed out to give to pt.  SW added SU resources to AVS.  SW went to visit pt in ICU.  Pt was asleep.  TOC to follow up at a later time.  Transition of Care Asessment:

## 2024-01-24 NOTE — Progress Notes (Signed)
 Pt extubated on roomair sats 100%, respiratory rate 18/min. No stridor noted.

## 2024-01-24 NOTE — Discharge Instructions (Signed)
 Substance use Resources   Intensive Outpatient Programs   [facility] [address] [place],  [place], [place] [phone]  [facility] (Inpatient and [phone] (Suboxone and Methadone) [address] [phone]  ADS: Alcohol  & Drug [facility] Programs - Intensive Outpatient [address] [place], [place] [address]  [phone]  [facility] (Outpatient, Inpatient, [facility] (Groups and Residental) (insurance only) [phone] [place], [place] [phone]   [facility] (for caregivers and family) [address] [place] [address], [place], [place] [phone]  Residential Treatment Programs  [facility](2 years) Residential: 62 days)[facility] ([facility] [address] [place], [place], [place] [phone] or [phone]  [facility] [address] [place], [place], [place] [phone]  [facility] ([facility]) [address] [place], [place], [place] [phone] Admissions: 8am-3pm M-F  [facility] Program: Residential Program [phone] Days)             [facility]: [place]  [facility], [place], [place]  [phone] or [phone] in Hours over the weekend or by referral)  [facility] [address] [place], [place] [address] [phone] (Do virtual or phone assessment, offer transportation within 25 miles, have in patient and Outpatient options)   Mobil Crisis: Therapeutic  Alternatives:[phone] (for crisis response 24 hours a day)

## 2024-01-24 NOTE — Plan of Care (Signed)
 Pt Alert and oriented to self and place. Hallucinating at times. Precedex  as needed for withdrawal symptoms. Safety measures in place and VSS.

## 2024-01-24 NOTE — Plan of Care (Signed)
  Problem: Activity: Goal: Ability to tolerate increased activity will improve Outcome: Progressing   Problem: Respiratory: Goal: Ability to maintain a clear airway and adequate ventilation will improve Outcome: Progressing   Problem: Nutrition: Goal: Adequate nutrition will be maintained Outcome: Progressing   Problem: Education: Goal: Knowledge of General Education information will improve Description: Including pain rating scale, medication(s)/side effects and non-pharmacologic comfort measures Outcome: Not Progressing   Problem: Health Behavior/Discharge Planning: Goal: Ability to manage health-related needs will improve Outcome: Not Progressing   Problem: Coping: Goal: Level of anxiety will decrease Outcome: Not Progressing

## 2024-01-24 NOTE — Progress Notes (Signed)
 NAME:  Ryan Rivers, MRN:  [id number], DOB:  03/01/1961, LOS: 2 ADMISSION DATE:  01/22/2024, CONSULTATION DATE:  01/22/2024 REFERRING MD:  Dr Claudene, CHIEF COMPLAINT:  Alcohol  Withdrawal    Brief Pt Description / Synopsis:  63 y.o. male admitted with Acute Metabolic Encephalopathy in the setting of severe Alcohol  Withdrawal with DT's requiring intubation and mechanical ventilation.  History of Present Illness:  Ryan Rivers is a 63 y.o. male with a past medical history significant for alcohol  abuse (up to 12 drinks daily) and hypertension who presented to [facility] ED on 01/22/24 for evaluation of hallucinations and altered mental status.  Patient is currently intubated and unable to contribute to history, therefore history is obtained from chart review.   The patient has been at [facility] for alcohol  detox for the past 2 days.  He reported his last drink was on Sunday 01/17/24. Patient was seen in the ED on 8/10 after his family brought him in for excessive drinking. Workup showed double-digit transaminitis. He followed up with his PCP the following day and was referred to [facility] for detox.  He was being treated with Phenobarbital , however last night 8/14 started having hallucinations.   01/24/24- patient is for SBT today.  Plan to liberate from MV.  Adequate UOP.  Daughter and mother at bedside.  Thrombocytopenia is improved.  EtOH withdrawal symptoms improved.   Pertinent  Medical History   Past Medical History:  Diagnosis Date   Abnormal LFTs (liver function tests)    Anxiety    Colon polyps    Erectile dysfunction    Hyperlipidemia    Hypertension    Insomnia    Microalbuminuria    Prostatitis     Significant Hospital Events: Including procedures, antibiotic start and stop dates in addition to other pertinent events   8/15: Presented to ED with hallucinations and severe ETOH withdrawal and DT's.  DT's worsened despite phenobarbital  and benzos, became very combative and  aggressive with staff ultimately requiring intubation in the ED.  PCCM asked to admit.    Objective   Blood pressure 128/71, pulse 63, temperature 98.1 F (36.7 C), temperature source Oral, resp. rate 14, height 5' 8 (1.727 m), weight 77 kg, SpO2 100%.    Vent Mode: PRVC FiO2 (%):  [24 %] 24 % Set Rate:  [6 bmp-14 bmp] 14 bmp Vt Set:  [430 mL-500 mL] 430 mL PEEP:  [5 cmH20] 5 cmH20 Plateau Pressure:  [19 cmH20] 19 cmH20   Intake/Output Summary (Last 24 hours) at 01/24/2024 9171 Last data filed at 01/24/2024 9197 Gross per 24 hour  Intake 2771.7 ml  Output 805 ml  Net 1966.7 ml   Filed Weights   01/22/24 0600 01/22/24 0944 01/24/24 0500  Weight: 70.8 kg 75.1 kg 77 kg    Examination: General: Critically ill appearing male, laying in bed, intubated and sedated, in NAD HENT: Atraumatic, normocephalic, neck supple, no JVD, orally intubated Lungs: Mechanical breath sounds throughout, even, nonlabored, synchronous with the vent Cardiovascular: Regular rate and rhythm, S1-S2, no murmurs, rubs, gallops Abdomen: Soft, nontender, nondistended, no guarding rebound tenderness, bowel sounds positive x 4 Extremities: Normal bulk and tone, no deformities, no edema, no cyanosis Neuro: Sedated, currently does not withdraw from pain or follow commands, pupils PERRLA GU: Foley catheter being placed  Resolved Hospital Problem list     Assessment & Plan:   #Acute Metabolic Encephalopathy #Severe Alcohol  Withdrawal with Delirium Tremens #Sedation needs in setting of mechanical ventilation -CT Head on  presentation negative for acute intracranial abnormality  -Maintain a RASS goal of 0 to -1 -Fentanyl  and Propofol  to maintain RASS goal -Avoid sedating medications as able -Daily wake up assessment -CIWA protocol -Start Librium  taper  -High dose thiamine  x3 days, followed by 100 mg daily -Folate and MVI  #Intubated for airway protection and patient/staff safety #Acute Hypoxic Respiratory  Failure -Full vent support, implement lung protective strategies -Plateau pressures less than 30 cm H20 -Wean FiO2 & PEEP as tolerated to maintain O2 sats >92% -Follow intermittent Chest X-ray & ABG as needed -Spontaneous Breathing Trials when respiratory parameters met and mental status permits -Implement VAP Bundle -Prn Bronchodilators  #Thrombocytopenia, suspect due to ETOH abuse #Anemia without overt s/sx of bleeding -Monitor for S/Sx of bleeding -Trend CBC -Heparin  SQ for VTE Prophylaxis (will do 5000 units BID dosing due to thrombocytopenia) -Transfuse for Hgb <7 -Smear with normal platelets  #Mild Hypomagnesemia -Monitor I&O's / urinary output -Follow BMP -Ensure adequate renal perfusion -Avoid nephrotoxic agents as able -Replace electrolytes as indicated ~ Pharmacy following for assistance with electrolyte replacement     Best Practice (right click and Reselect all SmartList Selections daily)   Diet/type: NPO, start tube feeds DVT prophylaxis: SCD, Heparin  SQ GI prophylaxis: PPI Lines: N/A Foley:  yes, and is still needed Code Status:  full code Last date of multidisciplinary goals of care discussion [8/15]   Labs   CBC: Recent Labs  Lab 01/17/24 1936 01/22/24 0101 01/22/24 0831 01/23/24 0625 01/24/24 0422  WBC 4.7 6.1  --  9.5 6.1  NEUTROABS  --  3.6  --   --   --   HGB 16.1 12.2* 12.8* 12.0* 12.6*  HCT 47.4 34.8*  --  34.8* 36.7*  MCV 103.7* 102.7*  --  105.1* 103.4*  PLT 127* 71*  --  83* 89*    Basic Metabolic Panel: Recent Labs  Lab 01/17/24 1936 01/22/24 0101 01/23/24 0625 01/23/24 1152 01/24/24 0422  NA 138 135 136  --  136  K 3.6 4.2 3.2* 3.5 3.5  CL 100 100 103  --  106  CO2 25 24 22   --  26  GLUCOSE 132* 94 120*  --  125*  BUN 6* 10 6*  --  7*  CREATININE 0.72 0.88 0.87  --  0.51*  CALCIUM 8.4* 8.7* 7.7*  --  7.9*  MG  --  1.6* 2.0  --  2.0  PHOS  --   --  3.6  --  2.6   GFR: Estimated Creatinine Clearance: 89.1 mL/min (A)  (by C-G formula based on SCr of 0.51 mg/dL (L)). Recent Labs  Lab 01/17/24 1936 01/22/24 0101 01/23/24 0625 01/24/24 0422  WBC 4.7 6.1 9.5 6.1    Liver Function Tests: Recent Labs  Lab 01/17/24 1936 01/22/24 0101 01/23/24 0625 01/24/24 0422  AST 95* 53*  --   --   ALT 57* 39  --   --   ALKPHOS 56 41  --   --   BILITOT 0.8 0.9  --   --   PROT 7.6 6.3*  --   --   ALBUMIN 4.0 3.5 2.7* 2.7*   No results for input(s): LIPASE, AMYLASE in the last 168 hours. No results for input(s): AMMONIA in the last 168 hours.  ABG    Component Value Date/Time   PHART 7.42 01/23/2024 0521   PCO2ART 43 01/23/2024 0521   PO2ART 82 (L) 01/23/2024 0521   HCO3 27.9 01/23/2024 0521   O2SAT  98.4 01/23/2024 0521     Coagulation Profile: No results for input(s): INR, PROTIME in the last 168 hours.  Cardiac Enzymes: No results for input(s): CKTOTAL, CKMB, CKMBINDEX, TROPONINI in the last 168 hours.  HbA1C: No results found for: HGBA1C  CBG: Recent Labs  Lab 01/23/24 1546 01/23/24 1953 01/23/24 2338 01/24/24 0402 01/24/24 0756  GLUCAP 144* 141* 108* 122* 128*    Review of Systems:   Unable to assess due to intubation/sedation/AMS   Past Medical History:  He,  has a past medical history of Abnormal LFTs (liver function tests), Anxiety, Colon polyps, Erectile dysfunction, Hyperlipidemia, Hypertension, Insomnia, Microalbuminuria, and Prostatitis.   Surgical History:   Past Surgical History:  Procedure Laterality Date   COLONOSCOPY  08/23/2013   COLONOSCOPY WITH PROPOFOL  N/A 02/16/2019   Procedure: COLONOSCOPY WITH PROPOFOL ;  Surgeon: Toledo, Ladell POUR, MD;  Location: [facility] ENDOSCOPY;  Service: Gastroenterology;  Laterality: N/A;   HEMORRHOID SURGERY       Social History:   reports that he has never smoked. He has never used smokeless tobacco. He reports current alcohol  use of about 16.0 standard drinks of alcohol  per week. He reports that he does not use  drugs.   Family History:  His family history is not on file.   Allergies Allergies  Allergen Reactions   Chlorothiazide Other (See Comments)    dysuria   Elemental Sulfur Other (See Comments)    Unknown reaction   Buspirone Other (See Comments)    Ankle swelling   Doxazosin Other (See Comments)    Aggression   Hydralazine Other (See Comments)    tachycardia   Losartan Other (See Comments)    tremor   Misc. Sulfonamide Containing Compounds Other (See Comments)   Sulfa Antibiotics Other (See Comments)     Home Medications  Prior to Admission medications   Medication Sig Start Date End Date Taking? Authorizing Provider  Nebivolol  HCl (BYSTOLIC  PO) Take 20 mg by mouth daily.   Yes [provider]  tadalafil (CIALIS) 20 MG tablet Take 20 mg by mouth daily as needed.   Yes [provider]     Critical care provider statement:   Total critical care time: 31 minutes   Performed by: Parris MD   Critical care time was exclusive of separately billable procedures and treating other patients.   Critical care was necessary to treat or prevent imminent or life-threatening deterioration.   Critical care was time spent personally by me on the following activities: development of treatment plan with patient and/or surrogate as well as nursing, discussions with consultants, evaluation of patient's response to treatment, examination of patient, obtaining history from patient or surrogate, ordering and performing treatments and interventions, ordering and review of laboratory studies, ordering and review of radiographic studies, pulse oximetry and re-evaluation of patient's condition.    Glady Ouderkirk, M.D.  Pulmonary & Critical Care Medicine

## 2024-01-24 NOTE — Consult Note (Signed)
 PHARMACY CONSULT NOTE - ELECTROLYTES  Pharmacy Consult for Electrolyte Monitoring and Replacement   Recent Labs: Height: 5' 8 (172.7 cm) Weight: 77 kg (169 lb 12.1 oz) IBW/kg (Calculated) : 68.4 Estimated Creatinine Clearance: 89.1 mL/min (A) (by C-G formula based on SCr of 0.51 mg/dL (L)). Potassium (mmol/L)  Date Value  01/24/2024 3.5  08/15/2011 3.2 (L)   Magnesium  (mg/dL)  Date Value  91/82/7974 2.0   Calcium (mg/dL)  Date Value  91/82/7974 7.9 (L)   Calcium, Total (mg/dL)  Date Value  96/91/7986 8.7   Albumin (g/dL)  Date Value  91/82/7974 2.7 (L)  08/15/2011 4.3   Phosphorus (mg/dL)  Date Value  91/82/7974 2.6   Sodium (mmol/L)  Date Value  01/24/2024 136  08/15/2011 139    Assessment  Ryan Rivers is a 63 y.o. male presenting with acute metabolic encephalopathy. PMH significant for anxiety, HLD, HTN. Pharmacy has been consulted to monitor and replace electrolytes.  Goal of Therapy: Electrolytes WNL  Plan:  No electrolyte replacement warranted for today Check BMP, Mg, Phos with AM labs  Thank you for allowing pharmacy to be a part of this patient's care.  Adriana JONETTA Bolster, PharmD Clinical Pharmacist 01/24/2024 7:05 AM

## 2024-01-25 DIAGNOSIS — F10931 Alcohol use, unspecified with withdrawal delirium: Secondary | ICD-10-CM

## 2024-01-25 DIAGNOSIS — I10 Essential (primary) hypertension: Secondary | ICD-10-CM

## 2024-01-25 DIAGNOSIS — D696 Thrombocytopenia, unspecified: Secondary | ICD-10-CM

## 2024-01-25 DIAGNOSIS — G9341 Metabolic encephalopathy: Secondary | ICD-10-CM | POA: Diagnosis not present

## 2024-01-25 DIAGNOSIS — D649 Anemia, unspecified: Secondary | ICD-10-CM

## 2024-01-25 DIAGNOSIS — J690 Pneumonitis due to inhalation of food and vomit: Secondary | ICD-10-CM

## 2024-01-25 DIAGNOSIS — E876 Hypokalemia: Secondary | ICD-10-CM

## 2024-01-25 DIAGNOSIS — J9601 Acute respiratory failure with hypoxia: Secondary | ICD-10-CM

## 2024-01-25 LAB — CULTURE, RESPIRATORY W GRAM STAIN

## 2024-01-25 LAB — RENAL FUNCTION PANEL
Albumin: 3 g/dL — ABNORMAL LOW (ref 3.5–5.0)
Anion gap: 10 (ref 5–15)
BUN: 7 mg/dL — ABNORMAL LOW (ref 8–23)
CO2: 26 mmol/L (ref 22–32)
Calcium: 8.4 mg/dL — ABNORMAL LOW (ref 8.9–10.3)
Chloride: 103 mmol/L (ref 98–111)
Creatinine, Ser: 0.67 mg/dL (ref 0.61–1.24)
GFR, Estimated: >60 mL/min (ref >60)
Glucose, Bld: 96 mg/dL (ref 70–99)
Phosphorus: 2.4 mg/dL — ABNORMAL LOW (ref 2.5–4.6)
Potassium: 3.4 mmol/L — ABNORMAL LOW (ref 3.5–5.1)
Sodium: 139 mmol/L (ref 135–145)

## 2024-01-25 LAB — CBC
HCT: 36 % — ABNORMAL LOW (ref 39.0–52.0)
Hemoglobin: 12.5 g/dL — ABNORMAL LOW (ref 13.0–17.0)
MCH: 35.4 pg — ABNORMAL HIGH (ref 26.0–34.0)
MCHC: 34.7 g/dL (ref 30.0–36.0)
MCV: 102 fL — ABNORMAL HIGH (ref 80.0–100.0)
Platelets: 125 K/uL — ABNORMAL LOW (ref 150–400)
RBC: 3.53 MIL/uL — ABNORMAL LOW (ref 4.22–5.81)
RDW: 11.7 % (ref 11.5–15.5)
WBC: 5.9 K/uL (ref 4.0–10.5)
nRBC: 0 % (ref 0.0–0.2)

## 2024-01-25 LAB — GLUCOSE, CAPILLARY
Glucose-Capillary: 101 mg/dL — ABNORMAL HIGH (ref 70–99)
Glucose-Capillary: 104 mg/dL — ABNORMAL HIGH (ref 70–99)
Glucose-Capillary: 119 mg/dL — ABNORMAL HIGH (ref 70–99)
Glucose-Capillary: 84 mg/dL (ref 70–99)
Glucose-Capillary: 94 mg/dL (ref 70–99)
Glucose-Capillary: 95 mg/dL (ref 70–99)

## 2024-01-25 LAB — MAGNESIUM: Magnesium: 1.9 mg/dL (ref 1.7–2.4)

## 2024-01-25 MED ORDER — THIAMINE MONONITRATE 100 MG PO TABS
100.0000 mg | ORAL_TABLET | Freq: Every day | ORAL | Status: DC
  Administered 2024-01-26 – 2024-01-28 (×3): 100 mg via ORAL
  Filled 2024-01-25 (×3): qty 1

## 2024-01-25 MED ORDER — POTASSIUM CHLORIDE 10 MEQ/100ML IV SOLN
10.0000 meq | INTRAVENOUS | Status: AC
  Administered 2024-01-25 (×4): 10 meq via INTRAVENOUS
  Filled 2024-01-25 (×4): qty 100

## 2024-01-25 MED ORDER — MAGNESIUM SULFATE 2 GM/50ML IV SOLN
2.0000 g | Freq: Once | INTRAVENOUS | Status: AC
  Administered 2024-01-25: 2 g via INTRAVENOUS
  Filled 2024-01-25: qty 50

## 2024-01-25 MED ORDER — ACETAMINOPHEN 325 MG PO TABS: 650.0000 mg | ORAL_TABLET | Freq: Four times a day (QID) | ORAL | Status: DC | PRN

## 2024-01-25 MED ORDER — CHLORDIAZEPOXIDE HCL 25 MG PO CAPS: 25.0000 mg | ORAL_CAPSULE | Freq: Four times a day (QID) | ORAL | Status: DC | PRN

## 2024-01-25 MED ORDER — ONDANSETRON HCL 4 MG/2ML IJ SOLN: 4.0000 mg | Freq: Four times a day (QID) | INTRAMUSCULAR | Status: DC | PRN

## 2024-01-25 MED ORDER — POTASSIUM PHOSPHATES 15 MMOLE/5ML IV SOLN
15.0000 mmol | Freq: Once | INTRAVENOUS | Status: AC
  Administered 2024-01-25: 15 mmol via INTRAVENOUS
  Filled 2024-01-25: qty 5

## 2024-01-25 MED ORDER — ACETAMINOPHEN 650 MG RE SUPP: 650.0000 mg | Freq: Four times a day (QID) | RECTAL | Status: DC | PRN

## 2024-01-25 MED ORDER — TRAZODONE HCL 50 MG PO TABS
50.0000 mg | ORAL_TABLET | Freq: Every day | ORAL | Status: DC
  Administered 2024-01-25: 50 mg via ORAL
  Filled 2024-01-25: qty 1

## 2024-01-25 MED ORDER — CHLORDIAZEPOXIDE HCL 25 MG PO CAPS
25.0000 mg | ORAL_CAPSULE | Freq: Every day | ORAL | Status: AC
  Administered 2024-01-25: 25 mg via ORAL
  Filled 2024-01-25: qty 1

## 2024-01-25 MED ORDER — SODIUM CHLORIDE 0.9 % IV SOLN
3.0000 g | Freq: Four times a day (QID) | INTRAVENOUS | Status: DC
  Administered 2024-01-25 (×2): 3 g via INTRAVENOUS
  Filled 2024-01-25 (×6): qty 8

## 2024-01-25 MED ORDER — HYALURONIDASE HUMAN 150 UNIT/ML IJ SOLN
150.0000 [IU] | Freq: Once | INTRAMUSCULAR | Status: AC
  Administered 2024-01-25: 150 [IU] via SUBCUTANEOUS
  Filled 2024-01-25: qty 1

## 2024-01-25 MED ORDER — ONDANSETRON HCL 4 MG PO TABS: 4.0000 mg | ORAL_TABLET | Freq: Four times a day (QID) | ORAL | Status: DC | PRN

## 2024-01-25 MED ORDER — ENSURE PLUS HIGH PROTEIN PO LIQD
237.0000 mL | Freq: Three times a day (TID) | ORAL | Status: DC
  Administered 2024-01-26 – 2024-01-28 (×5): 237 mL via ORAL

## 2024-01-25 NOTE — Progress Notes (Signed)
 NAME:  Ryan Rivers, MRN:  [id number], DOB:  04-06-1961, LOS: 3 ADMISSION DATE:  01/22/2024, CONSULTATION DATE:  01/22/2024 REFERRING MD:  Dr Claudene, CHIEF COMPLAINT:  Alcohol  Withdrawal    Brief Pt Description / Synopsis:  63 y.o. male admitted with Acute Metabolic Encephalopathy in the setting of severe Alcohol  Withdrawal with DT's requiring intubation and mechanical ventilation.  History of Present Illness:  Ryan Rivers is a 63 y.o. male with a past medical history significant for alcohol  abuse (up to 12 drinks daily) and hypertension who presented to [facility] ED on 01/22/24 for evaluation of hallucinations and altered mental status.  Patient is currently intubated and unable to contribute to history, therefore history is obtained from chart review.   The patient has been at the [facility] for alcohol  detox for the past 2 days.  He reported his last drink was on Sunday 01/17/24. Patient was seen in the ED on 8/10 after his family brought him in for excessive drinking. Workup showed double-digit transaminitis. He followed up with his PCP the following day and was referred to [facility] for detox.  He was being treated with Phenobarbital , however last night 8/14 started having hallucinations.   ED Course: Initial Vital Signs: Temperature 98.3 F, pulse 77, RR 16, BP 130/69, SpO2 98% Significant Labs: Magnesium  1.6, AST 53, Hgb 12.2, Hct 34.8, MCV 102.7, MCH 36, platelets 71 UA negative for UTI UDS + for barbiturates Imaging Chest X-ray>>IMPRESSION: 1. Satisfactory ET tube and enteric tube placement. 2. Lower lung volumes. No acute cardiopulmonary abnormality. CT Head wo contrast>>IMPRESSION: 1. No acute intracranial abnormality. 2. Mild chronic microvascular ischemic disease. Medications Administered:  phenobarbital  IV 130 mg with a repeat dose however he continued to hallucinate, becoming agitated whereupon he was given IV midazolam  and subsequently diazepam  with some improvement.     After sedation, he became hypoxic with O2 sats of 85% of which he was placed on 2L nasal cannula, reported to be protecting his airway.  TRH was planning to admit,  however he became extremely combative and agressive with medical staff.  Subsequently required intubation and mechanical ventilation for both patient and staff safety.   PCCM asked to admit for further workup and treatment.  Please see Significant Hospital Events section below for full detailed hospital course.   Pertinent  Medical History   Past Medical History:  Diagnosis Date   Abnormal LFTs (liver function tests)    Anxiety    Colon polyps    Erectile dysfunction    Hyperlipidemia    Hypertension    Insomnia    Microalbuminuria    Prostatitis     Micro Data:  8/15: MRSA PCR>> negative 8/16: Tracheal aspirate>> gram + cocci, gram - rods  Antimicrobials:   Anti-infectives (From admission, onward)    None       Significant Hospital Events: Including procedures, antibiotic start and stop dates in addition to other pertinent events   8/15: Presented to ED with hallucinations and severe ETOH withdrawal and DT's.  DT's worsened despite phenobarbital  and benzos, became very combative and aggressive with staff ultimately requiring intubation in the ED.  PCCM asked to admit.  01/24/24- patient is for SBT today.  Plan to liberate from MV ~ EXTUBATED. Adequate UOP.  Daughter and mother at bedside.  Thrombocytopenia is improved.  EtOH withdrawal symptoms improved.  01/25/24- No significant events noted overnight, tolerating extubation well from respiratory standpoint.  Weaned off Precedex  this morning, remains calm, DT's resolving. Tracheal aspirate with  gram + cocci and gram - rods, start empiric Unasyn .  Consult Speech/PT/OT.  Interim History / Subjective:  As outlined above under Significant Hospital Events section  Objective   Blood pressure 134/79, pulse 74, temperature 98 F (36.7 C), temperature source  Oral, resp. rate (!) 21, height 5' 8 (1.727 m), weight 74.1 kg, SpO2 99%.    Vent Mode: PSV FiO2 (%):  [21 %] 21 % PEEP:  [5 cmH20] 5 cmH20 Pressure Support:  [5 cmH20] 5 cmH20   Intake/Output Summary (Last 24 hours) at 01/25/2024 0809 Last data filed at 01/25/2024 0636 Gross per 24 hour  Intake 1001.66 ml  Output 2640 ml  Net -1638.34 ml   Filed Weights   01/22/24 0944 01/24/24 0500 01/25/24 0331  Weight: 75.1 kg 77 kg 74.1 kg    Examination: General: Acutely ill appearing male, sitting in bed,on nasal cannula, in NAD HENT: Atraumatic, normocephalic, neck supple, no JVD Lungs: Clear breath sounds throughout, even, nonlabored, normal effort Cardiovascular: Regular rate and rhythm, S1-S2, no murmurs, rubs, gallops Abdomen: Soft, nontender, nondistended, no guarding rebound tenderness, bowel sounds positive x 4 Extremities: Normal bulk and tone, no deformities, no edema, no cyanosis Neuro: Awake and alert, oriented to person and place, follow commands, no focal deficits noted, speech clear, pupils PERRLA GU: Foley catheter in place  Resolved Hospital Problem list     Assessment & Plan:   #Acute Metabolic Encephalopathy ~ IMPROVING #Severe Alcohol  Withdrawal with Delirium Tremens -CT Head on presentation negative for acute intracranial abnormality  -Treatment of metabolic derangements as outlined above -Provide supportive care -Promote normal sleep/wake cycle and family presence -Avoid sedating medications as able -CIWA protocol -Librium  taper  -High dose thiamine  x3 days, followed by 100 mg daily -Folate and MVI  #Intubated for airway protection and patient/staff safety ~ EXTUBATED 8/17 #Acute Hypoxic Respiratory Failure #Questionable Pneumonia -Supplemental O2 as needed to maintain O2 sats >92% -Follow intermittent Chest X-ray & ABG as needed -Bronchodilators prn -ABX as above -Pulmonary toilet as able  #Concern for possible pneumonia -Monitor fever curve -Trend  WBC's & Procalcitonin -Follow cultures as above -Continue empiric Unasyn  pending cultures & sensitivities  #Thrombocytopenia, suspect due to ETOH abuse ~ IMPROVING #Anemia without overt s/sx of bleeding ~ STABLE  -Monitor for S/Sx of bleeding -Trend CBC -Heparin  SQ for VTE Prophylaxis (will do 5000 units BID dosing due to thrombocytopenia) -Transfuse for Hgb <7 -Smear with normal platelets  #Mild Hypokalemia -Monitor I&O's / urinary output -Follow BMP -Ensure adequate renal perfusion -Avoid nephrotoxic agents as able -Replace electrolytes as indicated ~ Pharmacy following for assistance with electrolyte replacement     Best Practice (right click and Reselect all SmartList Selections daily)   Diet/type: NPO, speech evaluation pending DVT prophylaxis: SCD, Heparin  SQ GI prophylaxis: PPI Lines: N/A Foley:  yes, will plan to remove 8/18 Code Status:  full code Last date of multidisciplinary goals of care discussion [8/18]  8/18: Pt and Pt's mother updated at bedside on plan of care.  Labs   CBC: Recent Labs  Lab 01/22/24 0101 01/22/24 0831 01/23/24 0625 01/24/24 0422 01/25/24 0248  WBC 6.1  --  9.5 6.1 5.9  NEUTROABS 3.6  --   --   --   --   HGB 12.2* 12.8* 12.0* 12.6* 12.5*  HCT 34.8*  --  34.8* 36.7* 36.0*  MCV 102.7*  --  105.1* 103.4* 102.0*  PLT 71*  --  83* 89* 125*    Basic Metabolic Panel: Recent Labs  Lab  01/22/24 0101 01/23/24 0625 01/23/24 1152 01/24/24 0422 01/25/24 0248  NA 135 136  --  136 139  K 4.2 3.2* 3.5 3.5 3.4*  CL 100 103  --  106 103  CO2 24 22  --  26 26  GLUCOSE 94 120*  --  125* 96  BUN 10 6*  --  7* 7*  CREATININE 0.88 0.87  --  0.51* 0.67  CALCIUM 8.7* 7.7*  --  7.9* 8.4*  MG 1.6* 2.0  --  2.0 1.9  PHOS  --  3.6  --  2.6 2.4*   GFR: Estimated Creatinine Clearance: 89.1 mL/min (by C-G formula based on SCr of 0.67 mg/dL). Recent Labs  Lab 01/22/24 0101 01/23/24 0625 01/24/24 0422 01/25/24 0248  WBC 6.1 9.5 6.1 5.9     Liver Function Tests: Recent Labs  Lab 01/22/24 0101 01/23/24 0625 01/24/24 0422 01/25/24 0248  AST 53*  --   --   --   ALT 39  --   --   --   ALKPHOS 41  --   --   --   BILITOT 0.9  --   --   --   PROT 6.3*  --   --   --   ALBUMIN 3.5 2.7* 2.7* 3.0*   No results for input(s): LIPASE, AMYLASE in the last 168 hours. No results for input(s): AMMONIA in the last 168 hours.  ABG    Component Value Date/Time   PHART 7.42 01/23/2024 0521   PCO2ART 43 01/23/2024 0521   PO2ART 82 (L) 01/23/2024 0521   HCO3 27.9 01/23/2024 0521   O2SAT 98.4 01/23/2024 0521     Coagulation Profile: No results for input(s): INR, PROTIME in the last 168 hours.  Cardiac Enzymes: No results for input(s): CKTOTAL, CKMB, CKMBINDEX, TROPONINI in the last 168 hours.  HbA1C: No results found for: HGBA1C  CBG: Recent Labs  Lab 01/24/24 1923 01/24/24 1939 01/24/24 2330 01/25/24 0323 01/25/24 0717  GLUCAP 98 113* 103* 101* 94    Review of Systems:   Positives in BOLD: Gen: Denies fever, chills, weight change, fatigue/malaise, night sweats HEENT: Denies blurred vision, double vision, hearing loss, tinnitus, sinus congestion, rhinorrhea, sore throat, neck stiffness, dysphagia PULM: Denies shortness of breath, cough, sputum production, hemoptysis, wheezing CV: Denies chest pain, edema, orthopnea, paroxysmal nocturnal dyspnea, palpitations GI: Denies abdominal pain, nausea, vomiting, diarrhea, hematochezia, melena, constipation, change in bowel habits GU: Denies dysuria, hematuria, polyuria, oliguria, urethral discharge Endocrine: Denies hot or cold intolerance, polyuria, polyphagia or appetite change Derm: Denies rash, dry skin, scaling or peeling skin change Heme: Denies easy bruising, bleeding, bleeding gums Neuro: Denies headache, numbness, weakness, slurred speech, loss of memory or consciousness    Past Medical History:  He,  has a past medical history of Abnormal  LFTs (liver function tests), Anxiety, Colon polyps, Erectile dysfunction, Hyperlipidemia, Hypertension, Insomnia, Microalbuminuria, and Prostatitis.   Surgical History:   Past Surgical History:  Procedure Laterality Date   COLONOSCOPY  08/23/2013   COLONOSCOPY WITH PROPOFOL  N/A 02/16/2019   Procedure: COLONOSCOPY WITH PROPOFOL ;  Surgeon: Toledo, Ladell POUR, MD;  Location: [facility] ENDOSCOPY;  Service: Gastroenterology;  Laterality: N/A;   HEMORRHOID SURGERY       Social History:   reports that he has never smoked. He has never used smokeless tobacco. He reports current alcohol  use of about 16.0 standard drinks of alcohol  per week. He reports that he does not use drugs.   Family History:  His family history is  not on file.   Allergies Allergies  Allergen Reactions   Chlorothiazide Other (See Comments)    dysuria   Elemental Sulfur Other (See Comments)    Unknown reaction   Buspirone Other (See Comments)    Ankle swelling   Doxazosin Other (See Comments)    Aggression   Hydralazine Other (See Comments)    tachycardia   Losartan Other (See Comments)    tremor   Misc. Sulfonamide Containing Compounds Other (See Comments)   Sulfa Antibiotics Other (See Comments)     Home Medications  Prior to Admission medications   Medication Sig Start Date End Date Taking? Authorizing Provider  Nebivolol  HCl (BYSTOLIC  PO) Take 20 mg by mouth daily.   Yes [provider]  tadalafil (CIALIS) 20 MG tablet Take 20 mg by mouth daily as needed.   Yes [provider]     Critical care time: 40 minutes     Inge Lecher, AGACNP-BC [facility] Prefer epic messenger for cross cover needs If after hours, please call E-link

## 2024-01-25 NOTE — Evaluation (Signed)
 Physical Therapy Evaluation Patient Details Name: Ryan Rivers MRN: [id number] DOB: 1960-08-22 Today's Date: 01/25/2024  History of Present Illness  Pt is a 63 y.o. male admitted for acute alcohol  withdrawal delirium (pt with encephalopathy, hallucinations, and confusion); [facility]  Intubated 01/22/24 and extubated 01/24/24.  PMH includes htn, alcohol  use disorder (up to 12 drinks daily), anxiety, insomnia.  Clinical Impression  Prior to recent medical concerns, pt reports being independent with functional mobility; lives alone; has family support.  Currently pt is CGA with transfer from recliner and mostly CGA with ambulation in hallway with RW use (min assist for balance when navigating obstacles in hallway).  Pt A&Ox4 but demonstrating impulsiveness and impaired safety awareness.  Pt would currently benefit from skilled PT to address noted impairments and functional limitations (see below for any additional details).  Upon hospital discharge, pt would benefit from ongoing therapy.     If plan is discharge home, recommend the following: A little help with walking and/or transfers;A little help with bathing/dressing/bathroom;Assistance with cooking/housework;Direct supervision/assist for medications management;Assist for transportation;Help with stairs or ramp for entrance;Supervision due to cognitive status   Can travel by private vehicle        Equipment Recommendations Rolling walker (2 wheels)  Recommendations for Other Services       Functional Status Assessment Patient has had a recent decline in their functional status and demonstrates the ability to make significant improvements in function in a reasonable and predictable amount of time.     Precautions / Restrictions Precautions Precautions: Fall Recall of Precautions/Restrictions: Impaired Precaution/Restrictions Comments: CIWA Restrictions Weight Bearing Restrictions Per Provider  Order: No      Mobility  Bed Mobility               General bed mobility comments: Deferred (pt in recliner beginning/end of session)    Transfers Overall transfer level: Needs assistance Equipment used: Rolling walker (2 wheels) Transfers: Sit to/from Stand Sit to Stand: Contact guard assist           General transfer comment: fairly strong stand from recliner; CGA for safety and assist for lines management    Ambulation/Gait Ambulation/Gait assistance: Contact guard assist, Min assist Gait Distance (Feet): 160 Feet Assistive device: Rolling walker (2 wheels) Gait Pattern/deviations: Step-through pattern, Decreased step length - right, Decreased step length - left Gait velocity: decreased     General Gait Details: mostly CGA but min assist for balance when navigating obstacles in hallway  Stairs            Wheelchair Mobility     Tilt Bed    Modified Rankin (Stroke Patients Only)       Balance Overall balance assessment: Needs assistance Sitting-balance support: No upper extremity supported, Feet supported Sitting balance-Leahy Scale: Good Sitting balance - Comments: steady reaching within BOS   Standing balance support: Bilateral upper extremity supported, During functional activity, Reliant on assistive device for balance Standing balance-Leahy Scale: Poor Standing balance comment: CGA mostly (min assist for balance during obstacle navigation in hallway)                             Pertinent Vitals/Pain Pain Assessment Pain Assessment: No/denies pain    Home Living Family/patient expects to be discharged to:: Private residence Living Arrangements: Alone Available Help at Discharge: Family;Available PRN/intermittently Type of Home: House Home Access: Stairs to enter Entrance Stairs-Rails: None Entrance Stairs-Number of  Steps: 5   Home Layout: One level;Laundry or work area in basement (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) Charity fundraiser: None      Prior Function Prior Level of Function : Independent/Modified Independent;Working/employed             Mobility Comments: Pt reports being independent with ambulation; pt reports no recent falls (has had some trips/near falls) ADLs Comments: Independent; 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)     Extremity/Trunk Assessment   Upper Extremity Assessment Upper Extremity Assessment: Generalized weakness LUE Deficits / Details: L forearm/hand noted to be swollen compared to R UE (nurse notified and aware)    Lower Extremity Assessment Lower Extremity Assessment: Generalized weakness    Cervical / Trunk Assessment Cervical / Trunk Assessment: Normal  Communication   Communication Communication: No apparent difficulties    Cognition Arousal: Alert Behavior During Therapy: Impulsive   PT - Cognitive impairments: Safety/Judgement, Problem solving                       PT - Cognition Comments: A&Ox4 Following commands: Impaired Following commands impaired: Follows one step commands inconsistently, Follows one step commands with increased time     Cueing Cueing Techniques: Verbal cues, Tactile cues     General Comments General comments (skin integrity, edema, etc.): HR 93-111 bpm and SpO2 sats 92% or greater on room air during session.  Nursing cleared pt for participation in physical therapy.  Pt agreeable to PT session.  Pt's son present during session; pt's sister arrived during session.    Exercises     Assessment/Plan    PT Assessment Patient needs continued PT services  PT Problem List Decreased strength;Decreased balance;Decreased mobility;Decreased cognition;Decreased knowledge of use of DME;Decreased safety awareness;Decreased knowledge of precautions       PT Treatment Interventions DME instruction;Gait training;Stair training;Functional mobility training;Therapeutic  activities;Therapeutic exercise;Balance training;Patient/family education    PT Goals (Current goals can be found in the Care Plan section)  Acute Rehab PT Goals Patient Stated Goal: to improve walking PT Goal Formulation: With patient Time For Goal Achievement: 02/08/24 Potential to Achieve Goals: Good    Frequency Min 3X/week     Co-evaluation               AM-PAC PT 6 Clicks Mobility  Outcome Measure Help needed turning from your back to your side while in a flat bed without using bedrails?: None Help needed moving from lying on your back to sitting on the side of a flat bed without using bedrails?: A Little Help needed moving to and from a bed to a chair (including a wheelchair)?: A Little Help needed standing up from a chair using your arms (e.g., wheelchair or bedside chair)?: A Little Help needed to walk in hospital room?: A Little Help needed climbing 3-5 steps with a railing? : A Little 6 Click Score: 19    End of Session Equipment Utilized During Treatment: Gait belt Activity Tolerance: Patient tolerated treatment well Patient left: in chair;with call bell/phone within reach;with chair alarm set;with family/visitor present Nurse Communication: Mobility status;Precautions;Other (comment) (L UE swelling; IV beeping) PT Visit Diagnosis: Unsteadiness on feet (R26.81);Muscle weakness (generalized) (M62.81)    Time: 8345-8282 PT Time Calculation (min) (ACUTE ONLY): 23 min   Charges:   PT Evaluation $PT Eval Low Complexity: 1 Low PT Treatments $Therapeutic Exercise: 8-22 mins PT General Charges $$ ACUTE PT VISIT: 1  Visit        Damien Caulk, PT 01/25/24, 5:40 PM

## 2024-01-25 NOTE — Evaluation (Signed)
 Clinical/Bedside Swallow Evaluation Patient Details  Name: Ryan Rivers MRN: [id number] Date of Birth: 09/04/1960  Today's Date: 01/25/2024 Time: SLP Start Time (ACUTE ONLY): 1100 SLP Stop Time (ACUTE ONLY): 1125 SLP Time Calculation (min) (ACUTE ONLY): 25 min  Past Medical History:  Past Medical History:  Diagnosis Date   Abnormal LFTs (liver function tests)    Anxiety    Colon polyps    Erectile dysfunction    Hyperlipidemia    Hypertension    Insomnia    Microalbuminuria    Prostatitis    Past Surgical History:  Past Surgical History:  Procedure Laterality Date   COLONOSCOPY  08/23/2013   COLONOSCOPY WITH PROPOFOL  N/A 02/16/2019   Procedure: COLONOSCOPY WITH PROPOFOL ;  Surgeon: Toledo, Ladell POUR, MD;  Location: [facility] ENDOSCOPY;  Service: Gastroenterology;  Laterality: N/A;   HEMORRHOID SURGERY     HPI:  Per progress note: Ryan Rivers is a 63 y.o. male with a past medical history significant for alcohol  abuse (up to 12 drinks daily) and hypertension who presented to [facility] ED on 01/22/24 for evaluation of hallucinations and altered mental status. CXR:  Low lung volumes, with patchy bibasilar consolidation favoring  atelectasis over airspace disease.    Assessment / Plan / Recommendation  Clinical Impression  Pt seen for bedside swallow evaluation s/p extubation (2 day intubation - extubated on 8/17). RN with concern for coughing. Pt with strong, congested cough at baseline, productive of secretions, with pt utilizing suction with assistance. Oral care completed- lips dry, adequate dentition. General weakness impacting efficient self feeding and slowing oral motor movements during intake. Trials completed of thin liquids, puree, and regular solids. No overt or subtle s/sx pharyngeal dysphagia noted. No change to vocal quality across trials. Vitals stable for duration of trials. Oral phase mildly prolonged related to oral manipulation, though pt with independent oral clearance.  Education shared regarding general aspiration precautions. Pt reported understanding.   Based on general deconditioning, current resp status, and recent intubation, pt is at increased risk for aspiration. Risk is managed with implementation of aspiration precautions (slow rate, small bites, elevated HOB, and alert for PO intake) and supervision/assistance with intake. Recommend initiation of thin liquids and mech soft solids (to aid oral phase burden). RN aware of recommendations. SLP will continue to follow.   SLP Visit Diagnosis: Dysphagia, unspecified (R13.10)    Aspiration Risk  Mild aspiration risk    Diet Recommendation   Dysphagia 3 (mechanical soft);Thin  Medication Administration: Whole meds with puree (crushed if needed)    Other  Recommendations Oral Care Recommendations: Oral care BID;Staff/trained caregiver to provide oral care     Assistance Recommended at Discharge  Supervision/assistance with PO intake  Functional Status Assessment Patient has had a recent decline in their functional status and demonstrates the ability to make significant improvements in function in a reasonable and predictable amount of time.  Frequency and Duration min 2x/week  2 weeks       Prognosis Prognosis for improved oropharyngeal function: Good Barriers to Reach Goals: Severity of deficits (overall medical deconditioning)      Swallow Study   General Date of Onset: 01/25/24 HPI: Per progress note: Ryan Rivers is a 63 y.o. male with a past medical history significant for alcohol  abuse (up to 12 drinks daily) and hypertension who presented to [facility] ED on 01/22/24 for evaluation of hallucinations and altered mental status. CXR:  Low lung volumes, with patchy bibasilar consolidation favoring  atelectasis over airspace disease. Type of Study:  Bedside Swallow Evaluation Previous Swallow Assessment: none in chart Diet Prior to this Study: NPO Temperature Spikes Noted: No Respiratory Status:  Nasal cannula (3L) History of Recent Intubation: Yes Total duration of intubation (days): 2 days Date extubated: 01/24/24 Behavior/Cognition: Alert;Cooperative;Pleasant mood Oral Cavity Assessment: Dry Oral Care Completed by SLP: Yes Oral Cavity - Dentition: Adequate natural dentition Vision: Functional for self-feeding Self-Feeding Abilities: Able to feed self;Needs set up Patient Positioning: Upright in bed Baseline Vocal Quality: Normal Volitional Cough: Congested Volitional Swallow: Able to elicit    Oral/Motor/Sensory Function Overall Oral Motor/Sensory Function: Within functional limits   Ice Chips Ice chips: Within functional limits Presentation: Spoon   Thin Liquid Thin Liquid: Within functional limits Presentation: Cup;Straw;Self Fed    Nectar Thick Nectar Thick Liquid: Not tested   Honey Thick Honey Thick Liquid: Not tested   Puree Puree: Within functional limits Presentation: Spoon;Self Fed   Solid     Solid: Within functional limits Presentation: Self Fed     [place] Lee Kalt Clapp, MS, CCC-SLP Speech Language Pathologist Rehab Services; [facility] [phone] (ascom)   [place] J Clapp 01/25/2024,11:54 AM

## 2024-01-25 NOTE — Progress Notes (Signed)
 Nutrition Follow-up  DOCUMENTATION CODES:   Not applicable  INTERVENTION:   Ensure Plus High Protein po TID, each supplement provides 350 kcal and 20 grams of protein  MVI and thiamine  po daily   Pt remains at refeed risk; recommend monitor potassium, magnesium  and phosphorus labs daily until stable  Daily weights  NUTRITION DIAGNOSIS:   Inadequate oral intake related to inability to eat as evidenced by NPO status. -resolving   GOAL:   Patient will meet greater than or equal to 90% of their needs -not met   MONITOR:   PO intake, Supplement acceptance, Labs, Weight trends, Skin, I & O's  ASSESSMENT:   63 y/o male with h/o HTN, anxiety, etoh abuse and HLD who is admitted with hallucinations, etoh withdrawal and altered mental status.  Pt extubated 8/17. Pt seen by SLP today and was initiated on a mechanical soft diet. RD will add supplements to help pt meet his estimated needs. Pt remains at refeed risk. No BM since admission. Per chart, pt is up ~7lbs since admission. Pt +1.7L on his I & Os.   Medications reviewed and include: folic acid , heparin , MVI, thiamine , unasyn , KPhos   Labs reviewed: K 3.4(L), BUN 7(L), P 2.4(L), Mg 1.9 wnl Folate- 20.6 wnl, B12 381- 8/15 Cbgs- 84, 94, 101 x 24 hrs   Diet Order:   Diet Order             DIET DYS 3 Room service appropriate? Yes; Fluid consistency: Thin  Diet effective now                  EDUCATION NEEDS:   Not appropriate for education at this time  Skin:  Skin Assessment: Reviewed RN Assessment  Last BM:  pta  Height:   Ht Readings from Last 1 Encounters:  01/22/24 5' 8 (1.727 m)    Weight:   Wt Readings from Last 1 Encounters:  01/25/24 74.1 kg    Ideal Body Weight:  70 kg  BMI:  Body mass index is 24.84 kg/m.  Estimated Nutritional Needs:   Kcal:  1900-2200kcal/day  Protein:  95-110g/day  Fluid:  1.9-2.2L/day  Augustin Shams MS, RD, LDN If unable to be reached, please send secure chat  to RD inpatient available from 8:00a-4:00p daily

## 2024-01-25 NOTE — Progress Notes (Signed)
 IV suspected fro infiltration. MD and Pharmacy made aware. No new orders. IV removed, arm elevated.

## 2024-01-25 NOTE — Evaluation (Signed)
 Occupational Therapy Evaluation Patient Details Name: Ryan Rivers MRN: [id number] DOB: Sep 29, 1960 Today's Date: 01/25/2024   History of Present Illness   Pt is a 63 y.o. male presented with acute alcohol  withdrawal delirium, [facility] admitted with Acute Metabolic Encephalopathy in the setting of severe Alcohol  Withdrawal with DT's requiring intubation and mechanical ventilation. He was extubated 8/17. PMH of HTN, alcohol  use disorder (up to 12 drinks daily)     Clinical Impressions Pt was seen for OT evaluation this date. Per pt report, he lives alone in a one level home with a basement. Has 4 STE with no HR and is IND with ADLs, IADLs and mobility at baseline. Denies falls, reports trips/near falls. Works in his shop in his basement.  Pt presents with deficits in strength, balance and cognition/command following, affecting safe and optimal ADL completion. Pt currently requires supervision for bed mobility. He needed Min A to donn bil socks seated at EOB. CGA for STS from EOB without AD use and Min/CGA for safety with SPT to recliner. Additional stand performed with marching in place requiring Min A d/t imbalance, provided RW and edu on proper use. Pt ambulated ~160 ft utilizing RW with cues for proximity and pace as he is impulsive at times and required Min/CGA d/t obstacle maneuvering. He was on 3L 02 on entry at 100%, placed on RA and maintained at 92% and above throughout session. He was left seated in recliner with all needs and chair alarm in place. Mother at bedside.  Pt would benefit from skilled OT services to address noted impairments and functional limitations to maximize safety and independence while minimizing future risk of falls, injury, and readmission. Do anticipate the need for follow up OT services upon acute hospital DC.      If plan is discharge home, recommend the following:   A little help with walking and/or  transfers;A little help with bathing/dressing/bathroom;Help with stairs or ramp for entrance;Assist for transportation;Assistance with cooking/housework     Functional Status Assessment   Patient has had a recent decline in their functional status and demonstrates the ability to make significant improvements in function in a reasonable and predictable amount of time.     Equipment Recommendations   Other (comment);Tub/shower seat (RW)     Recommendations for Other Services         Precautions/Restrictions   Precautions Precautions: Fall Recall of Precautions/Restrictions: Impaired Restrictions Weight Bearing Restrictions Per Provider Order: No     Mobility Bed Mobility Overal bed mobility: Needs Assistance Bed Mobility: Supine to Sit     Supine to sit: Supervision, HOB elevated, Used rails     General bed mobility comments: no physical assist need, cues and use of bed features    Transfers Overall transfer level: Needs assistance Equipment used: None, Rolling walker (2 wheels) Transfers: Sit to/from Stand, Bed to chair/wheelchair/BSC Sit to Stand: Contact guard assist     Step pivot transfers: Contact guard assist, Min assist     General transfer comment: CGA for STS from EOB without AD, MIN/CGA for step pivot to recliner without AD; additional stand from recliner with CGA and cues for hand placement, d/t unsteadiness with marching in place, utilized RW for ambulation  ~160 ft with Min/CGA for cueing and chair follow for safety      Balance Overall balance assessment: Needs assistance Sitting-balance support: Feet supported Sitting balance-Leahy Scale: Good     Standing balance support: Reliant on assistive device  for balance, Bilateral upper extremity supported Standing balance-Leahy Scale: Fair Standing balance comment: RW and CGA for safety                           ADL either performed or assessed with clinical judgement   ADL  Overall ADL's : Needs assistance/impaired                     Lower Body Dressing: Minimal assistance;Sitting/lateral leans Lower Body Dressing Details (indicate cue type and reason): to donn bil socks Toilet Transfer: Contact guard assist;Minimal assistance;Stand-pivot Toilet Transfer Details (indicate cue type and reason): simulated to recliner without AD         Functional mobility during ADLs: Contact guard assist;Cueing for safety;Rolling walker (2 wheels)       Vision         Perception         Praxis         Pertinent Vitals/Pain Pain Assessment Pain Assessment: No/denies pain     Extremity/Trunk Assessment Upper Extremity Assessment Upper Extremity Assessment: LUE deficits/detail;Generalized weakness LUE Deficits / Details: edema from IVs? reports they were going to be switching IVs to other arm   Lower Extremity Assessment Lower Extremity Assessment: Generalized weakness       Communication Communication Communication: No apparent difficulties   Cognition Arousal: Alert Behavior During Therapy: Impulsive               OT - Cognition Comments: oriented x3 during eval                 Following commands: Impaired Following commands impaired: Follows one step commands inconsistently, Follows one step commands with increased time     Cueing  General Comments   Cueing Techniques: Verbal cues;Tactile cues  VSS, removed from 3L and placed on RA and maintained sp02 at 92% and above   Exercises Other Exercises Other Exercises: Edu on role of OT in acute setting.   Shoulder Instructions      Home Living Family/patient expects to be discharged to:: Private residence Living Arrangements: Alone Available Help at Discharge: Family;Available PRN/intermittently Type of Home: House Home Access: Stairs to enter [employer] of Steps: 4 Entrance Stairs-Rails: None Home Layout: One level;Laundry or work area in basement  (flight of steps to basement where his shop is located)     Web designer Shower/Tub: Doctor, general practice: None          Prior Functioning/Environment Prior Level of Function : Independent/Modified Independent;Working/employed             Mobility Comments: reports IND with no AD use; denies falls, but results some trips/near falls ADLs Comments: IND, reports still working in his shop/machines/tools, etc. on his feet a lot    OT Problem List: Decreased strength;Decreased activity tolerance;Impaired balance (sitting and/or standing)   OT Treatment/Interventions: Self-care/ADL training;Therapeutic exercise;Therapeutic activities;Energy conservation;DME and/or AE instruction;Patient/family education;Balance training      OT Goals(Current goals can be found in the care plan section)   Acute Rehab OT Goals Patient Stated Goal: get better OT Goal Formulation: With patient Time For Goal Achievement: 02/08/24 Potential to Achieve Goals: Good ADL Goals Pt Will Perform Lower Body Bathing: with supervision;with modified independence;sit to/from stand;sitting/lateral leans Pt Will Perform Lower Body Dressing: with modified independence;with supervision;sit to/from stand;sitting/lateral leans Pt Will Transfer to Toilet: with modified independence;with supervision;ambulating;regular height toilet  OT Frequency:  Min 2X/week    Co-evaluation              AM-PAC OT 6 Clicks Daily Activity     Outcome Measure Help from another person eating meals?: None Help from another person taking care of personal grooming?: None Help from another person toileting, which includes using toliet, bedpan, or urinal?: A Little Help from another person bathing (including washing, rinsing, drying)?: A Little Help from another person to put on and taking off regular upper body clothing?: A Little Help from another person to put on and taking off regular  lower body clothing?: A Little 6 Click Score: 20   End of Session Equipment Utilized During Treatment: Rolling walker (2 wheels);Gait belt Nurse Communication: Mobility status  Activity Tolerance: Patient tolerated treatment well Patient left: in chair;with call bell/phone within reach;with chair alarm set;with family/visitor present  OT Visit Diagnosis: Unsteadiness on feet (R26.81);Other abnormalities of gait and mobility (R26.89);Muscle weakness (generalized) (M62.81)                Time: 8453-8382 OT Time Calculation (min): 31 min Charges:  OT General Charges $OT Visit: 1 Visit OT Evaluation $OT Eval Moderate Complexity: 1 Mod OT Treatments $Therapeutic Activity: 8-22 mins Ryan Rivers, OTR/L 01/25/24, 5:31 PM  Terran Klinke E Kalei Mckillop 01/25/2024, 5:27 PM

## 2024-01-25 NOTE — Plan of Care (Signed)
  Problem: Activity: Goal: Ability to tolerate increased activity will improve Outcome: Progressing   Problem: Respiratory: Goal: Ability to maintain a clear airway and adequate ventilation will improve Outcome: Progressing   Problem: Clinical Measurements: Goal: Ability to maintain clinical measurements within normal limits will improve Outcome: Progressing Goal: Will remain free from infection Outcome: Progressing   Problem: Elimination: Goal: Will not experience complications related to urinary retention Outcome: Progressing   Problem: Coping: Goal: Level of anxiety will decrease Outcome: Not Progressing   Problem: Safety: Goal: Ability to remain free from injury will improve Outcome: Not Progressing

## 2024-01-25 NOTE — Consult Note (Addendum)
 PHARMACY CONSULT NOTE - ELECTROLYTES  Pharmacy Consult for Electrolyte Monitoring and Replacement   Recent Labs: Potassium (mmol/L)  Date Value  01/25/2024 3.4 (L)  08/15/2011 3.2 (L)   Magnesium  (mg/dL)  Date Value  91/81/7974 1.9   Calcium (mg/dL)  Date Value  91/81/7974 8.4 (L)   Calcium, Total (mg/dL)  Date Value  96/91/7986 8.7   Albumin (g/dL)  Date Value  91/81/7974 3.0 (L)  08/15/2011 4.3   Phosphorus (mg/dL)  Date Value  91/81/7974 2.4 (L)   Sodium (mmol/L)  Date Value  01/25/2024 139  08/15/2011 139   Corrected Ca: 9.2 mg/dL  Height: 5' 8 (827.2 cm) Weight: 74.1 kg (163 lb 5.8 oz) IBW/kg (Calculated) : 68.4 Estimated Creatinine Clearance: 89.1 mL/min (by C-G formula based on SCr of 0.67 mg/dL).  Assessment  Ryan Rivers is a 63 y.o. male presenting with delirium tremens from alcohol  withdrawal. PMH significant for HTN. Pharmacy has been consulted to monitor and replace electrolytes.  Diet: NPO MIVF: N/A Pertinent medications: N/A  Goal of Therapy: Electrolytes within normal limits  Plan:  Potassium chloride  10 mEq IV x 4 has been ordered Give Magnesium  sulfate 2 g IV x 1  Give potassium phosphate  15 mmol IV x 1 Continue to monitor electrolytes daily   Thank you for allowing pharmacy to be a part of this patient's care.  Nasira Janusz [place], PharmD Candidate 01/25/2024 7:00 AM

## 2024-01-26 DIAGNOSIS — F10931 Alcohol use, unspecified with withdrawal delirium: Secondary | ICD-10-CM | POA: Diagnosis not present

## 2024-01-26 LAB — RENAL FUNCTION PANEL
Albumin: 3.3 g/dL — ABNORMAL LOW (ref 3.5–5.0)
Anion gap: 6 (ref 5–15)
BUN: 9 mg/dL (ref 8–23)
CO2: 30 mmol/L (ref 22–32)
Calcium: 8.6 mg/dL — ABNORMAL LOW (ref 8.9–10.3)
Chloride: 104 mmol/L (ref 98–111)
Creatinine, Ser: 0.66 mg/dL (ref 0.61–1.24)
GFR, Estimated: >60 mL/min (ref >60)
Glucose, Bld: 113 mg/dL — ABNORMAL HIGH (ref 70–99)
Phosphorus: 2.9 mg/dL (ref 2.5–4.6)
Potassium: 3.5 mmol/L (ref 3.5–5.1)
Sodium: 140 mmol/L (ref 135–145)

## 2024-01-26 LAB — CBC
HCT: 33.7 % — ABNORMAL LOW (ref 39.0–52.0)
Hemoglobin: 12 g/dL — ABNORMAL LOW (ref 13.0–17.0)
MCH: 35.8 pg — ABNORMAL HIGH (ref 26.0–34.0)
MCHC: 35.6 g/dL (ref 30.0–36.0)
MCV: 100.6 fL — ABNORMAL HIGH (ref 80.0–100.0)
Platelets: 204 K/uL (ref 150–400)
RBC: 3.35 MIL/uL — ABNORMAL LOW (ref 4.22–5.81)
RDW: 11.9 % (ref 11.5–15.5)
WBC: 5.5 K/uL (ref 4.0–10.5)
nRBC: 0 % (ref 0.0–0.2)

## 2024-01-26 LAB — GLUCOSE, CAPILLARY
Glucose-Capillary: 145 mg/dL — ABNORMAL HIGH (ref 70–99)
Glucose-Capillary: 118 mg/dL — ABNORMAL HIGH (ref 70–99)
Glucose-Capillary: 141 mg/dL — ABNORMAL HIGH (ref 70–99)
Glucose-Capillary: 159 mg/dL — ABNORMAL HIGH (ref 70–99)

## 2024-01-26 LAB — MAGNESIUM: Magnesium: 2.1 mg/dL (ref 1.7–2.4)

## 2024-01-26 MED ORDER — CHLORDIAZEPOXIDE HCL 25 MG PO CAPS
25.0000 mg | ORAL_CAPSULE | Freq: Four times a day (QID) | ORAL | Status: DC | PRN
  Filled 2024-01-26: qty 1

## 2024-01-26 MED ORDER — PENICILLIN V POTASSIUM 500 MG PO TABS
500.0000 mg | ORAL_TABLET | Freq: Three times a day (TID) | ORAL | Status: DC
  Administered 2024-01-26 – 2024-01-28 (×10): 500 mg via ORAL
  Filled 2024-01-26 (×11): qty 1

## 2024-01-26 MED ORDER — TRAZODONE HCL 100 MG PO TABS
100.0000 mg | ORAL_TABLET | Freq: Every day | ORAL | Status: DC
  Administered 2024-01-26: 100 mg via ORAL
  Filled 2024-01-26: qty 1

## 2024-01-26 MED ORDER — HYDROXYZINE HCL 25 MG PO TABS
25.0000 mg | ORAL_TABLET | Freq: Four times a day (QID) | ORAL | Status: DC | PRN
  Filled 2024-01-26: qty 1

## 2024-01-26 MED ORDER — MELATONIN 5 MG PO TABS
2.5000 mg | ORAL_TABLET | Freq: Every day | ORAL | Status: DC
  Administered 2024-01-26 – 2024-01-27 (×2): 2.5 mg via ORAL
  Filled 2024-01-26 (×2): qty 1

## 2024-01-26 NOTE — Progress Notes (Signed)
 Called bedside to evaluate patient by bedside RN after transfer with ICU charge nurse. Patient had removed telemetry monitoring and had gotten out of bed multiple times. Upon bedside assessment patient intermittently confused, A&O x2-3, and somewhat irritable. - We discussed the importance of staying in bed, getting sleep and telemetry monitoring -librium  PRN & atarax  PRN ordered per CIWA scale for the next 24 hours (as detox period should be waning). Patient refused medication but promised to stay in bed and was amenable to putting his telemetry back on for monitoring.  -instructed bedside RN to reach out if patient continues to get up out of bed.  -consider safety sitter as patient is redirectable     Jenita Ruth Rust-Chester, MSN, RN, AGACNP-BC, CCRN  [facility]

## 2024-01-26 NOTE — Progress Notes (Signed)
 Speech Language Pathology Treatment: Dysphagia  Patient Details Name: Ryan Rivers MRN: [id number] DOB: May 06, 1961 Today's Date: 01/26/2024 Time: 1435-1500 SLP Time Calculation (min) (ACUTE ONLY): 25 min  Assessment / Plan / Recommendation Clinical Impression  Pt seen for ongoing assessment of swallowing; diet upgrade back to a Regular consistency diet(baseline at home). He is alert, verbally responsive and engaged in conversation w/ SLP; noted min slower processing/response time.  Pt is on RA; wbc wnl. Afebrile.   Pt explained general aspiration precautions and agreed verbally to the need for following them especially sitting upright for all oral intake; small bites/sips. Pt fed himself sips of thin liquids via straw. No overt clinical s/s of aspiration were noted during; respiratory status remained calm and unlabored, vocal quality clear b/t trials, no coughing. Pt described eating baked chicken at meals w/ no trouble chewing at all; discussed foods/consistencies. Pt stated his Sister was bringing him some Art therapist. No oral phase deficits have been reported at meals per pt/Family/NSG Sitter present- WFL bolus management and timely A-P transfer for swallowing; oral clearing post meals/intake.   Pt appears at reduced risk for aspriation when following general aspiration precautions. Recommend a Regular consistency diet w/ moistened foods for ease of eating(gravies added to moisten foods); Thin liquids. Recommend general aspiration precautions; Pills Whole in Puree as needed per NSG; tray setup and positioning assistance for meals as needed. Reflux precautions d/t pt's baseline. ST services will sign off at this time w/ MD to reconsult if new needs while admitted. NSG updated. Precautions posted at bedside, chart.      HPI HPI: Per progress note: Ryan Rivers is a 63 y.o. male with a past medical history significant for alcohol  abuse (up to 12 drinks daily) and hypertension who presented  to [facility] ED on 01/22/24 for evaluation of hallucinations and altered mental status.SABRA  Unsure of pt's Baseline Cognitive functioning- Mild chronic microvascular ischemic disease, per Head CT.   CXR:  Low lung volumes, with patchy bibasilar consolidation favoring  atelectasis over airspace disease.      SLP Plan  All goals met          Recommendations  Diet recommendations: Regular;Thin liquid Liquids provided via: Cup;Straw Medication Administration: Whole meds with puree (as needed per NSG) Supervision: Patient able to self feed;Intermittent supervision to cue for compensatory strategies (setup) Compensations: Minimize environmental distractions;Slow rate;Small sips/bites;Lingual sweep for clearance of pocketing;Follow solids with liquid Postural Changes and/or Swallow Maneuvers: Out of bed for meals;Seated upright 90 degrees;Upright 30-60 min after meal                 (Dietician f/u if needed) Oral care BID;Patient independent with oral care (setup)   Intermittent Supervision/Assistance (d/t Baseline issues; Sitter present currently) Dysphagia, unspecified (R13.10) (suspect impact from ETOH abuse/use)     All goals met        Ryan Portugal, MS, CCC-SLP Speech Language Pathologist Rehab Services; [facility] [phone] (ascom) Ryan Rivers  01/26/2024, 4:52 PM

## 2024-01-26 NOTE — Hospital Course (Signed)
 63yo who was admitted on 8/15 with Acute Metabolic Encephalopathy in the setting of severe Alcohol  Withdrawal with DT's requiring intubation and mechanical ventilation. He was successfully extubated yesterday, weaned off Precedex  earlier this morning. DT's resolving, managing with prn Librium . Speech/PT/OT consulted.

## 2024-01-26 NOTE — Progress Notes (Signed)
 Received pt from ICU RN. Pt is confused and pulling lines/tele box off repeatedly. ICU MD paged for pt evaluation

## 2024-01-26 NOTE — Plan of Care (Signed)
  Problem: Activity: Goal: Ability to tolerate increased activity will improve Outcome: Progressing   Problem: Respiratory: Goal: Ability to maintain a clear airway and adequate ventilation will improve Outcome: Progressing   Problem: Role Relationship: Goal: Method of communication will improve Outcome: Progressing   Problem: Education: Goal: Knowledge of General Education information will improve Description: Including pain rating scale, medication(s)/side effects and non-pharmacologic comfort measures Outcome: Progressing   Problem: Health Behavior/Discharge Planning: Goal: Ability to manage health-related needs will improve Outcome: Progressing   Problem: Clinical Measurements: Goal: Ability to maintain clinical measurements within normal limits will improve Outcome: Progressing Goal: Will remain free from infection Outcome: Progressing Goal: Diagnostic test results will improve Outcome: Progressing Goal: Respiratory complications will improve Outcome: Progressing Goal: Cardiovascular complication will be avoided Outcome: Progressing   Problem: Activity: Goal: Risk for activity intolerance will decrease Outcome: Progressing   Problem: Nutrition: Goal: Adequate nutrition will be maintained Outcome: Progressing   Problem: Coping: Goal: Level of anxiety will decrease Outcome: Progressing   Problem: Elimination: Goal: Will not experience complications related to bowel motility Outcome: Progressing Goal: Will not experience complications related to urinary retention Outcome: Progressing   Problem: Pain Managment: Goal: General experience of comfort will improve and/or be controlled Outcome: Progressing   Problem: Safety: Goal: Ability to remain free from injury will improve Outcome: Progressing   Problem: Skin Integrity: Goal: Risk for impaired skin integrity will decrease Outcome: Progressing

## 2024-01-26 NOTE — Progress Notes (Signed)
 Contacted by care RN regarding patient's agitation.   - Antibiotic adjusted to PO during day time hours to support sleep and reduce agitation as the patient is improving clinically.  - Safety sitter ordered   Jenita Ruth Rust-Chester, AGACNP-BC Acute Care Nurse Practitioner [facility]   [phone] / [phone] Please see Amion for details.

## 2024-01-26 NOTE — Progress Notes (Signed)
 Pt is exremely confused this shift. He is refusing care, medication and telemetry. CCMD, charge RN and ICU provider contacted about pt agitation. Pt refused to take agitation medication. Pt also refused antibiotic. Pt is non redirectable and became more agitated as the shift went on. He is under the belief that he is leaving today. Pt family called and updated. This RN sat with pt from approx 5a till shift change due to agitation and pt attempts to leave

## 2024-01-26 NOTE — Progress Notes (Signed)
 Physical Therapy Treatment Patient Details Name: Ryan Rivers MRN: [id number] DOB: [birth year] Today's Date: 01/26/2024   History of Present Illness Pt is a 63 y.o. male admitted for acute alcohol  withdrawal delirium (pt with encephalopathy, hallucinations, and confusion); [facility]  Intubated 01/22/24 and extubated 01/24/24.  PMH includes htn, alcohol  use disorder (up to 12 drinks daily), anxiety, insomnia.    PT Comments  Pt sitting on EOB (pt's mother present) upon PT arrival; pt agreeable to therapy; nurse reports pt has been walking to bathroom (no AD) today.  During session pt was SBA with transfers and CGA ambulating 500 feet (no AD use).  Pt demonstrates significant increased speed with ambulation (pt and pt's mom reports pt/family all walk fast at baseline) requiring cueing to slow down for safety.  Higher level balance impairments and impulsiveness noted (see below for details).  Will continue to focus on higher level balance, safety, and functional mobility during hospitalization.   If plan is discharge home, recommend the following: A little help with walking and/or transfers;A little help with bathing/dressing/bathroom;Assistance with cooking/housework;Direct supervision/assist for medications management;Assist for transportation;Help with stairs or ramp for entrance;Supervision due to cognitive status   Can travel by private vehicle        Equipment Recommendations  None recommended by PT    Recommendations for Other Services       Precautions / Restrictions Precautions Precautions: Fall Recall of Precautions/Restrictions: Impaired Precaution/Restrictions Comments: CIWA Restrictions Weight Bearing Restrictions Per Provider Order: No     Mobility  Bed Mobility               General bed mobility comments: Deferred (pt sitting on edge of bed beginning/end of session)    Transfers Overall transfer level: Needs  assistance Equipment used: None Transfers: Sit to/from Stand Sit to Stand: Supervision           General transfer comment: steady (although quick) stand from bed x4 trials    Ambulation/Gait Ambulation/Gait assistance: Contact guard assist Gait Distance (Feet): 500 Feet Assistive device: None Gait Pattern/deviations: Step-through pattern Gait velocity: increased     General Gait Details: intermittent vc's to slow down (pt's mom reports pt walks quickly at baseline)   Optometrist     Tilt Bed    Modified Rankin (Stroke Patients Only)       Balance Overall balance assessment: Needs assistance Sitting-balance support: No upper extremity supported, Feet supported Sitting balance-Leahy Scale: Good Sitting balance - Comments: steady reaching within BOS   Standing balance support: No upper extremity supported Standing balance-Leahy Scale: Good Standing balance comment: steady reaching within BOS Single Leg Stance - Right Leg: 1 Single Leg Stance - Left Leg: 6 Tandem Stance - Right Leg: 3 (assist to steady getting into position) Tandem Stance - Left Leg: 15 Rhomberg - Eyes Opened: 30 Rhomberg - Eyes Closed: 30 (mild sway; CGA for safety)   High Level Balance Comments: Minimal deviation in path with ambulation and head turns R/L/up/down, increasing/decreasing speed, and turning and stopping             Communication Communication Communication: No apparent difficulties  Cognition Arousal: Alert Behavior During Therapy: Impulsive   PT - Cognitive impairments: Safety/Judgement, Problem solving                       PT - Cognition Comments: A&Ox4 Following commands:  Impaired Following commands impaired: Follows multi-step commands inconsistently, Follows multi-step commands with increased time    Cueing Cueing Techniques: Verbal cues, Tactile cues  Exercises      General Comments General comments (skin integrity,  edema, etc.): HR 93-123 bpm and SpO2 sats 99% or greater on room air during session.  Nursing cleared pt for participation in physical therapy.  Pt agreeable to PT session.      Pertinent Vitals/Pain Pain Assessment Pain Assessment: Faces Faces Pain Scale: Hurts a little bit Pain Location: R UE IV's Pain Descriptors / Indicators: Discomfort Pain Intervention(s): Limited activity within patient's tolerance, Monitored during session    Home Living                          Prior Function            PT Goals (current goals can now be found in the care plan section) Acute Rehab PT Goals Patient Stated Goal: to improve walking PT Goal Formulation: With patient Time For Goal Achievement: 02/08/24 Potential to Achieve Goals: Good Progress towards PT goals: Progressing toward goals    Frequency    Min 3X/week      PT Plan      Co-evaluation              AM-PAC PT 6 Clicks Mobility   Outcome Measure  Help needed turning from your back to your side while in a flat bed without using bedrails?: None Help needed moving from lying on your back to sitting on the side of a flat bed without using bedrails?: None Help needed moving to and from a bed to a chair (including a wheelchair)?: A Little Help needed standing up from a chair using your arms (e.g., wheelchair or bedside chair)?: A Little Help needed to walk in hospital room?: A Little Help needed climbing 3-5 steps with a railing? : A Little 6 Click Score: 20    End of Session Equipment Utilized During Treatment: Gait belt Activity Tolerance: Patient tolerated treatment well Patient left:  (sitting on EOB with pt's mom and pt's daughter present; sitter arriving) Nurse Communication: Mobility status;Precautions PT Visit Diagnosis: Unsteadiness on feet (R26.81);Muscle weakness (generalized) (M62.81)     Time: 8478-8453 PT Time Calculation (min) (ACUTE ONLY): 25 min  Charges:    $Gait Training: 8-22  mins $Therapeutic Activity: 8-22 mins PT General Charges $$ ACUTE PT VISIT: 1 Visit                     Damien Caulk, PT 01/26/24, 5:26 PM

## 2024-01-26 NOTE — Progress Notes (Signed)
 Progress Note   Patient: Ryan Rivers FMW:[id number] DOB: [birth year] DOA: 01/22/2024     4 DOS: the patient was seen and examined on 01/26/2024   Brief hospital course: 63yo who was admitted on 8/15 with Acute Metabolic Encephalopathy in the setting of severe Alcohol  Withdrawal with DT's requiring intubation and mechanical ventilation. He was successfully extubated yesterday, weaned off Precedex  earlier this morning. DT's resolving, managing with prn Librium . Speech/PT/OT consulted.   Assessment and Plan:   Acute Metabolic Encephalopathy associated with DTs Patient with severe alcohol  withdrawal and resultant encephalopathy Negative head CT on presentation This is slowly improving However, he remains with some confusion (improved with family present), hallucinations He appears to need ongoing monitoring to allow for improved cognition prior to dc He is reluctantly in agreement with this plan Promote normal sleep/wake cycle and family presence Avoid sedating medications as able  Severe Alcohol  Withdrawal with Delirium Tremens CIWA protocol Librium  taper  High dose thiamine  x3 days, followed by 100 mg daily Folate and MVI Prolonged discussion about ETOH use d/o - he has had 4 prolonged periods of sobriety in the past, understands importance of AA and 90 meetings in 90 days plus getting a sponsor Family is planning to pursue [place] for themselves  Anemia New anemia of 12, down from 16 on 8/10 Macrocytic Appears to be stable Normal B12 and folate Treated with high-dose thiamine   Hypoxia, related to sedation He had been intubated for airway protection but was extubated to [place] O2 Resolved, on RA Concern for possible PNA, treated currently with PCN and will stop after 5 days of abx  HTN (hypertension) Normotensive to soft so we will hold off antihypertensives Slowly uptrending, may need resumption soon of nebivolol   Ambulatory dysfunction Associated with encephalopathy/DTs PT  and OT consulted Recommending HH PT/OT     Consultants: PCCM Nutrition PT OT SLP   Procedures: Intubation 8/15-17  Antibiotics: Unasyn  8/18-19 PCN 8/19-  30 Day Unplanned Readmission Risk Score    Flowsheet Row ED to Hosp-Admission (Current) from [facility]/22/2024 in [facility]  30 Day Unplanned Readmission Risk Score (%) 15.34 Filed at 01/26/2024 0400    This score is the patient's risk of an unplanned readmission within 30 days of being discharged (0 -100%). The score is based on dignosis, age, lab data, medications, orders, and past utilization.   Low:  0-14.9   Medium: 15-21.9   High: 22-29.9   Extreme: 30 and above           Subjective: Very agitated this AM, threatening to leave AMA.  Sitter present.  Much calmer upon arrival of family; they have been asked to remain at the bedside 24/7 if possible (sitter may be able to be canceled in that scenario).  Calmer but still confused, planning to discharge but not really aware of where he is (thinks he is in ICU), hallucinating.   Objective: Vitals:   01/25/24 2333 01/26/24 0846  BP: 139/76 (!) 141/77  Pulse: 92 99  Resp: 18 18  Temp: 98.8 F (37.1 C) 97.9 F (36.6 C)  SpO2: 94% 99%    Intake/Output Summary (Last 24 hours) at 01/26/2024 1508 Last data filed at 01/26/2024 0929 Gross per 24 hour  Intake 717.97 ml  Output 3075 ml  Net -2357.03 ml   Filed Weights   01/24/24 0500 01/25/24 0331 01/26/24 0434  Weight: 77 kg 74.1 kg 77.3 kg    Exam:  General:  Appears calm and comfortable and is in  NAD, mildly disheveled Eyes:   normal lids, iris ENT:  grossly normal hearing, lips & tongue, mmm Cardiovascular:  RRR. No LE edema.  Respiratory:   CTA bilaterally with no wheezes/rales/rhonchi.  Normal respiratory effort. Abdomen:  soft, NT, ND Skin:  no rash or induration seen on limited exam Musculoskeletal:  grossly normal tone BUE/BLE, good ROM, no bony  abnormality Psychiatric:  mildly confused/agitated mood and affect, speech fluent and mostly appropriate, AOx2-3 Neurologic:  CN 2-12 grossly intact, moves all extremities in coordinated fashion  Data Reviewed: I have reviewed the patient's lab results since admission.  Pertinent labs for today include:  Glucose 113 Albumin 3.3 WBC 5.5 Hgb 12    Family Communication: Mother, daughter were present throughout evaluation  Disposition: Status is: Inpatient Remains inpatient appropriate because: ongoing management     Time spent: 50 minutes  Unresulted Labs (From admission, onward)     Start     Ordered   01/27/24 0500  Hepatic function panel  Tomorrow morning,   R       Question:  Specimen collection method  Answer:  Lab=Lab collect   01/26/24 1508   01/23/24 0500  CBC  Daily,   R      01/22/24 0739   01/23/24 0500  Renal function panel  Daily,   R      01/22/24 9260             Author: Delon Herald, MD 01/26/2024 3:08 PM  For on call review [email or web address].

## 2024-01-27 ENCOUNTER — Inpatient Hospital Stay

## 2024-01-27 DIAGNOSIS — E876 Hypokalemia: Secondary | ICD-10-CM | POA: Insufficient documentation

## 2024-01-27 DIAGNOSIS — F10931 Alcohol use, unspecified with withdrawal delirium: Secondary | ICD-10-CM | POA: Diagnosis not present

## 2024-01-27 DIAGNOSIS — J9601 Acute respiratory failure with hypoxia: Secondary | ICD-10-CM | POA: Diagnosis not present

## 2024-01-27 LAB — CBC
HCT: 35 % — ABNORMAL LOW (ref 39.0–52.0)
Hemoglobin: 11.8 g/dL — ABNORMAL LOW (ref 13.0–17.0)
MCH: 35.1 pg — ABNORMAL HIGH (ref 26.0–34.0)
MCHC: 33.7 g/dL (ref 30.0–36.0)
MCV: 104.2 fL — ABNORMAL HIGH (ref 80.0–100.0)
Platelets: 270 K/uL (ref 150–400)
RBC: 3.36 MIL/uL — ABNORMAL LOW (ref 4.22–5.81)
RDW: 11.8 % (ref 11.5–15.5)
WBC: 4.1 K/uL (ref 4.0–10.5)
nRBC: 0 % (ref 0.0–0.2)

## 2024-01-27 LAB — HEPATIC FUNCTION PANEL
ALT: 32 U/L (ref 0–44)
AST: 43 U/L — ABNORMAL HIGH (ref 15–41)
Albumin: 3.1 g/dL — ABNORMAL LOW (ref 3.5–5.0)
Alkaline Phosphatase: 36 U/L — ABNORMAL LOW (ref 38–126)
Bilirubin, Direct: <0.1 mg/dL (ref 0.0–0.2)
Total Bilirubin: 0.2 mg/dL (ref 0.0–1.2)
Total Protein: 5.9 g/dL — ABNORMAL LOW (ref 6.5–8.1)

## 2024-01-27 LAB — RENAL FUNCTION PANEL
Albumin: 3 g/dL — ABNORMAL LOW (ref 3.5–5.0)
Anion gap: 11 (ref 5–15)
BUN: 8 mg/dL (ref 8–23)
CO2: 24 mmol/L (ref 22–32)
Calcium: 8.5 mg/dL — ABNORMAL LOW (ref 8.9–10.3)
Chloride: 104 mmol/L (ref 98–111)
Creatinine, Ser: 0.82 mg/dL (ref 0.61–1.24)
GFR, Estimated: >60 mL/min (ref >60)
Glucose, Bld: 133 mg/dL — ABNORMAL HIGH (ref 70–99)
Phosphorus: 3.6 mg/dL (ref 2.5–4.6)
Potassium: 3.1 mmol/L — ABNORMAL LOW (ref 3.5–5.1)
Sodium: 139 mmol/L (ref 135–145)

## 2024-01-27 LAB — GLUCOSE, CAPILLARY
Glucose-Capillary: 105 mg/dL — ABNORMAL HIGH (ref 70–99)
Glucose-Capillary: 119 mg/dL — ABNORMAL HIGH (ref 70–99)
Glucose-Capillary: 126 mg/dL — ABNORMAL HIGH (ref 70–99)
Glucose-Capillary: 138 mg/dL — ABNORMAL HIGH (ref 70–99)

## 2024-01-27 MED ORDER — POTASSIUM CHLORIDE 20 MEQ PO PACK
40.0000 meq | PACK | Freq: Once | ORAL | Status: AC
  Administered 2024-01-27: 40 meq via ORAL
  Filled 2024-01-27: qty 2

## 2024-01-27 MED ORDER — ENOXAPARIN SODIUM 40 MG/0.4ML IJ SOSY
40.0000 mg | PREFILLED_SYRINGE | INTRAMUSCULAR | Status: DC
  Administered 2024-01-27: 40 mg via SUBCUTANEOUS
  Filled 2024-01-27: qty 0.4

## 2024-01-27 MED ORDER — QUETIAPINE FUMARATE 25 MG PO TABS
25.0000 mg | ORAL_TABLET | Freq: Every day | ORAL | Status: DC
  Administered 2024-01-27: 25 mg via ORAL
  Filled 2024-01-27: qty 1

## 2024-01-27 NOTE — Progress Notes (Signed)
 Patient daughter at bedside bringing patient food for dinner.Patient is calm and eating dinner.Took his evening  pills.Sitter at bedside

## 2024-01-27 NOTE — Consult Note (Signed)
 PHARMACY CONSULT NOTE - ELECTROLYTES  Pharmacy Consult for Electrolyte Monitoring and Replacement   Recent Labs: Potassium (mmol/L)  Date Value  01/27/2024 3.1 (L)  08/15/2011 3.2 (L)   Magnesium  (mg/dL)  Date Value  91/80/7974 2.1   Calcium (mg/dL)  Date Value  91/79/7974 8.5 (L)   Calcium, Total (mg/dL)  Date Value  96/91/7986 8.7   Albumin (g/dL)  Date Value  91/79/7974 3.0 (L)  01/27/2024 3.1 (L)  08/15/2011 4.3   Phosphorus (mg/dL)  Date Value  91/79/7974 3.6   Sodium (mmol/L)  Date Value  01/27/2024 139  08/15/2011 139   Corrected Ca: 9.2 mg/dL  Height: 5' 8 (827.2 cm) Weight: 75.1 kg (165 lb 9.1 oz) IBW/kg (Calculated) : 68.4 Estimated Creatinine Clearance: 86.9 mL/min (by C-G formula based on SCr of 0.82 mg/dL).  Assessment  Ryan Rivers is a 63 y.o. male presenting with delirium tremens from alcohol  withdrawal. PMH significant for HTN. Pharmacy has been consulted to monitor and replace electrolytes.  Diet: regular MIVF: N/A Pertinent medications: N/A  Goal of Therapy: Electrolytes within normal limits  8/20: K 3.1, low (goal 3.5-5.1)  Plan:  Potassium chloride  40 mEq  x 1 Will recheck K with AM labs  Continue to monitor electrolytes daily   Thank you for allowing pharmacy to be a part of this patient's care.  Ransom Blanch PGY-1 Pharmacy Resident  [place] [facility]  01/27/2024 12:22 PM

## 2024-01-27 NOTE — Plan of Care (Signed)
  Problem: Activity: Goal: Ability to tolerate increased activity will improve Outcome: Progressing   Problem: Respiratory: Goal: Ability to maintain a clear airway and adequate ventilation will improve Outcome: Progressing   Problem: Role Relationship: Goal: Method of communication will improve Outcome: Progressing   Problem: Education: Goal: Knowledge of General Education information will improve Description: Including pain rating scale, medication(s)/side effects and non-pharmacologic comfort measures Outcome: Progressing   Problem: Health Behavior/Discharge Planning: Goal: Ability to manage health-related needs will improve Outcome: Progressing   Problem: Clinical Measurements: Goal: Ability to maintain clinical measurements within normal limits will improve Outcome: Progressing Goal: Will remain free from infection Outcome: Progressing Goal: Diagnostic test results will improve Outcome: Progressing Goal: Respiratory complications will improve Outcome: Progressing Goal: Cardiovascular complication will be avoided Outcome: Progressing   Problem: Activity: Goal: Risk for activity intolerance will decrease Outcome: Progressing   Problem: Nutrition: Goal: Adequate nutrition will be maintained Outcome: Progressing   Problem: Coping: Goal: Level of anxiety will decrease Outcome: Progressing   Problem: Elimination: Goal: Will not experience complications related to bowel motility Outcome: Progressing Goal: Will not experience complications related to urinary retention Outcome: Progressing   Problem: Pain Managment: Goal: General experience of comfort will improve and/or be controlled Outcome: Progressing   Problem: Safety: Goal: Ability to remain free from injury will improve Outcome: Progressing   Problem: Skin Integrity: Goal: Risk for impaired skin integrity will decrease Outcome: Progressing

## 2024-01-27 NOTE — Progress Notes (Addendum)
  Progress Note   Patient: Ryan Rivers FMW:[id number] DOB: 1960-09-28 DOA: 01/22/2024     5 DOS: the patient was seen and examined on 01/27/2024   Brief hospital course: 63yo who was admitted on 8/15 with Acute Metabolic Encephalopathy in the setting of severe Alcohol  Withdrawal with DT's requiring intubation and mechanical ventilation. He was successfully extubated yesterday, weaned off Precedex  earlier this morning. DT's resolving, managing with prn Librium .  Patient condition appears to be improving, still has some sundowning.   Principal Problem:   Alcohol  withdrawal delirium, acute, hyperactive (HCC) Active Problems:   Anemia   HTN (hypertension)   Hypoxia, related to sedation   Aspiration pneumonia of both lungs (HCC)   Acute respiratory failure with hypoxia (HCC)   Assessment and Plan: Acute Metabolic Encephalopathy associated with DTs Severe Alcohol  Withdrawal with Delirium Tremens Acute hypoxemia respiratory failure secondary to severe encephalopathy Patient was initially intubated and managed in the ICU.  This is secondary to severe encephalopathy from delirium tremens.  Conditions overall have improved. Patient has completed CIWA protocol since yesterday.  However, patient still has significant sundowning with confusion. 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 He did not sleep well last night, I have changed his sleeping aids from high dose of trazodone  to lower dose Seroquel .  Continue melatonin. Initial CT scan was negative for acute changes, repeat CT scan today still negative.  Hypokalemia Repleted, recheck potassium and magnesium  tomorrow  Anemia Appears to be mild.  HTN (hypertension) Restarted nebivolol    Ambulatory dysfunction Continue PT/OT after discharge.      Subjective:  Patient overall has improved, but still had some sundowning yesterday evening.  Physical Exam: Vitals:   01/26/24 2029 01/27/24 0500 01/27/24 0546 01/27/24 0819   BP: (!) 149/73  127/67 (!) 152/94  Pulse: 93  81 89  Resp: 17  18 18   Temp:   98.7 F (37.1 C) 98.2 F (36.8 C)  TempSrc:      SpO2: 97%  95% 95%  Weight:  75.1 kg    Height:       General exam: Appears calm and comfortable  Respiratory system: Clear to auscultation. Respiratory effort normal. Cardiovascular system: S1 & S2 heard, RRR. No JVD, murmurs, rubs, gallops or clicks. No pedal edema. Gastrointestinal system: Abdomen is nondistended, soft and nontender. No organomegaly or masses felt. Normal bowel sounds heard. Central nervous system: Alert and oriented. No focal neurological deficits. Extremities: Symmetric 5 x 5 power. Skin: No rashes, lesions or ulcers Psychiatry: Judgement and insight appear normal. Mood & affect appropriate.    Data Reviewed:  CT scan results and lab results reviewed.  Family Communication: Daughter updated at bedside  Disposition: Status is: Inpatient Remains inpatient appropriate because: Severity of disease, altered mental status     Time spent: 35 minutes  Author: Murvin Mana, MD 01/27/2024 12:26 PM  For on call review [email or web address].

## 2024-01-27 NOTE — Progress Notes (Signed)
 Physical Therapy Treatment Patient Details Name: Ryan Rivers MRN: [id number] DOB: 01-02-1961 Today's Date: 01/27/2024   History of Present Illness Pt is a 63 y.o. male admitted for acute alcohol  withdrawal delirium (pt with encephalopathy, hallucinations, and confusion); [facility]  Intubated 01/22/24 and extubated 01/24/24.  PMH includes htn, alcohol  use disorder (up to 12 drinks daily), anxiety, insomnia.    PT Comments  Patient displayed some impulsivity during session but easily redirected, able to follow multistep commands. Overall mobilized at a supervision/CGA level, no AD. Did seem to have decreased insight into balance deficits, encouraged to follow up with skilled PT intervention to decrease risk of falls. DGI of 21, but limited ability to perform tandem stance, rhomberg stance, and single leg stance, as well as a few staggered steps with head turns while ambulating. Pt back in bed with family at bedside, needs in reach. The patient would benefit from further skilled PT intervention to continue to progress towards goals.    If plan is discharge home, recommend the following: A little help with walking and/or transfers;A little help with bathing/dressing/bathroom;Assistance with cooking/housework;Direct supervision/assist for medications management;Assist for transportation;Help with stairs or ramp for entrance;Supervision due to cognitive status   Can travel by private vehicle        Equipment Recommendations  None recommended by PT    Recommendations for Other Services       Precautions / Restrictions Precautions Precautions: Fall Recall of Precautions/Restrictions: Impaired Precaution/Restrictions Comments: CIWA Restrictions Weight Bearing Restrictions Per Provider Order: No     Mobility  Bed Mobility         Supine to sit: Modified independent (Device/Increase time)          Transfers Overall transfer level: Needs  assistance Equipment used: None Transfers: Sit to/from Stand Sit to Stand: Supervision                Ambulation/Gait Ambulation/Gait assistance: Supervision, Contact guard assist Gait Distance (Feet): 500 Feet Assistive device: None   Gait velocity: increased but able to improve safety with cues         Stairs             Wheelchair Mobility     Tilt Bed    Modified Rankin (Stroke Patients Only)       Balance Overall balance assessment: Needs assistance Sitting-balance support: No upper extremity supported, Feet supported Sitting balance-Leahy Scale: Normal     Standing balance support: No upper extremity supported Standing balance-Leahy Scale: Good   Single Leg Stance - Right Leg: 1 Single Leg Stance - Left Leg: 3             Standardized Balance Assessment Standardized Balance Assessment : Dynamic Gait Index   Dynamic Gait Index Level Surface: Normal Change in Gait Speed: Normal Gait with Horizontal Head Turns: Mild Impairment Gait with Vertical Head Turns: Mild Impairment Gait and Pivot Turn: Normal Step Over Obstacle: Normal Step Around Obstacles: Normal Steps: Mild Impairment (clinical judgement) Total Score: 21      Communication Communication Communication: No apparent difficulties  Cognition Arousal: Alert Behavior During Therapy: Impulsive   PT - Cognitive impairments: Safety/Judgement, Problem solving                       PT - Cognition Comments: A&Ox4   Following commands impaired: Only follows one step commands consistently    Cueing Cueing Techniques: Verbal cues, Tactile cues  Exercises  General Comments        Pertinent Vitals/Pain Pain Assessment Pain Assessment: No/denies pain    Home Living                          Prior Function            PT Goals (current goals can now be found in the care plan section) Progress towards PT goals: Progressing toward goals     Frequency    Min 3X/week      PT Plan      Co-evaluation              AM-PAC PT 6 Clicks Mobility   Outcome Measure  Help needed turning from your back to your side while in a flat bed without using bedrails?: None Help needed moving from lying on your back to sitting on the side of a flat bed without using bedrails?: None Help needed moving to and from a bed to a chair (including a wheelchair)?: None Help needed standing up from a chair using your arms (e.g., wheelchair or bedside chair)?: None Help needed to walk in hospital room?: A Little Help needed climbing 3-5 steps with a railing? : A Little 6 Click Score: 22    End of Session Equipment Utilized During Treatment: Gait belt Activity Tolerance: Patient tolerated treatment well Patient left:  (sitting on EOB with pt's mom and pt's daughter present; sitter arriving) Nurse Communication: Mobility status PT Visit Diagnosis: Unsteadiness on feet (R26.81);Muscle weakness (generalized) (M62.81)     Time: 9047-8991 PT Time Calculation (min) (ACUTE ONLY): 16 min  Charges:    $Therapeutic Activity: 8-22 mins PT General Charges $$ ACUTE PT VISIT: 1 Visit                     Doyal Shams PT, DPT 10:56 AM,01/27/24

## 2024-01-28 ENCOUNTER — Other Ambulatory Visit: Payer: Self-pay

## 2024-01-28 DIAGNOSIS — J690 Pneumonitis due to inhalation of food and vomit: Secondary | ICD-10-CM | POA: Diagnosis not present

## 2024-01-28 DIAGNOSIS — J9601 Acute respiratory failure with hypoxia: Secondary | ICD-10-CM | POA: Diagnosis not present

## 2024-01-28 DIAGNOSIS — F10931 Alcohol use, unspecified with withdrawal delirium: Secondary | ICD-10-CM | POA: Diagnosis not present

## 2024-01-28 LAB — POTASSIUM: Potassium: 4.6 mmol/L (ref 3.5–5.1)

## 2024-01-28 LAB — BASIC METABOLIC PANEL WITH GFR
Anion gap: 7 (ref 5–15)
BUN: 8 mg/dL (ref 8–23)
CO2: 28 mmol/L (ref 22–32)
Calcium: 8.9 mg/dL (ref 8.9–10.3)
Chloride: 106 mmol/L (ref 98–111)
Creatinine, Ser: 0.61 mg/dL (ref 0.61–1.24)
GFR, Estimated: >60 mL/min
Glucose, Bld: 98 mg/dL (ref 70–99)
Potassium: 3.2 mmol/L — ABNORMAL LOW (ref 3.5–5.1)
Sodium: 141 mmol/L (ref 135–145)

## 2024-01-28 LAB — PHOSPHORUS: Phosphorus: 4.2 mg/dL (ref 2.5–4.6)

## 2024-01-28 LAB — MAGNESIUM: Magnesium: 2.1 mg/dL (ref 1.7–2.4)

## 2024-01-28 MED ORDER — HYDROCORTISONE 0.5 % EX OINT
TOPICAL_OINTMENT | Freq: Two times a day (BID) | CUTANEOUS | Status: DC
  Filled 2024-01-28: qty 28.35

## 2024-01-28 MED ORDER — POTASSIUM CHLORIDE CRYS ER 20 MEQ PO TBCR
40.0000 meq | EXTENDED_RELEASE_TABLET | ORAL | Status: AC
  Administered 2024-01-28 (×3): 40 meq via ORAL
  Filled 2024-01-28 (×3): qty 2

## 2024-01-28 MED ORDER — PENICILLIN V POTASSIUM 500 MG PO TABS
500.0000 mg | ORAL_TABLET | Freq: Three times a day (TID) | ORAL | 0 refills | Status: AC
  Filled 2024-01-28: qty 8, 2d supply, fill #0

## 2024-01-28 MED ORDER — HYDROCORTISONE 0.5 % EX CREA
TOPICAL_CREAM | Freq: Two times a day (BID) | CUTANEOUS | Status: DC
  Filled 2024-01-28: qty 28.35

## 2024-01-28 MED ORDER — QUETIAPINE FUMARATE 25 MG PO TABS
25.0000 mg | ORAL_TABLET | Freq: Every evening | ORAL | 0 refills | Status: AC | PRN
  Filled 2024-01-28: qty 30, 30d supply, fill #0

## 2024-01-28 MED ORDER — NEBIVOLOL HCL 10 MG PO TABS
20.0000 mg | ORAL_TABLET | Freq: Every day | ORAL | Status: DC
  Administered 2024-01-28: 20 mg via ORAL
  Filled 2024-01-28: qty 2

## 2024-01-28 NOTE — Plan of Care (Signed)
  Problem: Activity: Goal: Ability to tolerate increased activity will improve Outcome: Progressing   Problem: Respiratory: Goal: Ability to maintain a clear airway and adequate ventilation will improve Outcome: Progressing   Problem: Role Relationship: Goal: Method of communication will improve Outcome: Progressing   Problem: Education: Goal: Knowledge of General Education information will improve Description: Including pain rating scale, medication(s)/side effects and non-pharmacologic comfort measures Outcome: Progressing   Problem: Health Behavior/Discharge Planning: Goal: Ability to manage health-related needs will improve Outcome: Progressing   Problem: Clinical Measurements: Goal: Ability to maintain clinical measurements within normal limits will improve Outcome: Progressing Goal: Will remain free from infection Outcome: Progressing Goal: Diagnostic test results will improve Outcome: Progressing Goal: Respiratory complications will improve Outcome: Progressing Goal: Cardiovascular complication will be avoided Outcome: Progressing   Problem: Activity: Goal: Risk for activity intolerance will decrease Outcome: Progressing   Problem: Nutrition: Goal: Adequate nutrition will be maintained Outcome: Progressing   Problem: Coping: Goal: Level of anxiety will decrease Outcome: Progressing   Problem: Elimination: Goal: Will not experience complications related to bowel motility Outcome: Progressing Goal: Will not experience complications related to urinary retention Outcome: Progressing   Problem: Pain Managment: Goal: General experience of comfort will improve and/or be controlled Outcome: Progressing   Problem: Safety: Goal: Ability to remain free from injury will improve Outcome: Progressing   Problem: Skin Integrity: Goal: Risk for impaired skin integrity will decrease Outcome: Progressing

## 2024-01-28 NOTE — TOC Initial Note (Signed)
 Transition of Care [facility]) - Initial/Assessment Note    Patient Details  Name: Ryan Rivers MRN: [id number] Date of Birth: Jun 20, 1960  [facility]) CM/SW Contact:    Asberry CHRISTELLA Jaksch, RN Phone Number: 01/28/2024, 12:29 PM  Clinical Narrative:                 Admitted for: Alcohol  withdrawal Admitted from: Home  PCP: Rudolpho Norleen JONETTA, MD Current home health/prior home health/DME: None   Spoke with patient at bedside. He states he lives at home by himself. He drives himself to appointments. I discussed recommendations for outpatient PT and HH OT. He refuses services. He states that he is very active and has a workshop in his basement that keeps him active. Per patient his son Ninetta is to provide transport at DC.   Expected Discharge Plan: Home/Self Care Barriers to Discharge: Continued Medical Work up   Patient Goals and CMS Choice Patient states their goals for this hospitalization and ongoing recovery are:: Going home          Expected Discharge Plan and Services                                   HH Arranged: Patient Refused HH          Prior Living Arrangements/Services   Lives with:: Self Patient language and need for interpreter reviewed:: Yes Do you feel safe going back to the place where you live?: Yes        Care giver support system in place?: Yes (comment)      Activities of Daily Living      Permission Sought/Granted                  Emotional Assessment Appearance:: Appears stated age     Orientation: : Oriented to Self, Oriented to Place, Oriented to  Time, Oriented to Situation Alcohol  / Substance Use: Alcohol  Use (Substance use resources added to AVS)    Admission diagnosis:  DTs (delirium tremens) (HCC) [F10.931] Alcohol  withdrawal delirium, acute, hyperactive (HCC) [F10.931] Patient Active Problem List   Diagnosis Date Noted   Hypokalemia 01/27/2024   Aspiration pneumonia of both lungs (HCC) 01/25/2024    Acute respiratory failure with hypoxia (HCC) 01/25/2024   Alcohol  withdrawal delirium, acute, hyperactive (HCC) 01/22/2024   HTN (hypertension) 01/22/2024   Anemia 01/22/2024   Hypoxia, related to sedation 01/22/2024   PCP:  Rudolpho Norleen JONETTA, MD Pharmacy:   [facility] [phone] - [place], [place] - [address] [place] [address] Phone: [phone] Fax: [phone]     Social Drivers of Health (SDOH) Social History: SDOH Screenings   Food Insecurity: No Food Insecurity (01/22/2024)  Housing: Low Risk  (01/22/2024)  Transportation Needs: No Transportation Needs (01/22/2024)  Utilities: Not At Risk (01/22/2024)  Social Connections: Socially Isolated (01/22/2024)  Tobacco Use: Low Risk  (01/22/2024)   SDOH Interventions:     Readmission Risk Interventions     No data to display

## 2024-01-28 NOTE — Discharge Summary (Addendum)
 Physician Discharge Summary   Patient: Ryan Rivers MRN: [id number] DOB: 1961-05-21  Admit date:     01/22/2024  Discharge date: 01/28/24  Discharge Physician: Murvin Mana   PCP: Rudolpho Norleen JONETTA, MD   Recommendations at discharge:    Follow up with PCP in 1 week  Discharge Diagnoses: Principal Problem:   Alcohol  withdrawal delirium, acute, hyperactive (HCC) Active Problems:   Anemia   HTN (hypertension)   Hypoxia, related to sedation   Aspiration pneumonia of both lungs (HCC)   Acute respiratory failure with hypoxia (HCC)   Hypokalemia  Resolved Problems:   * No resolved hospital problems. [facility] Course: 63yo who was admitted on 8/15 with Acute Metabolic Encephalopathy in the setting of severe Alcohol  Withdrawal with DT's requiring intubation and mechanical ventilation. He was successfully extubated yesterday, weaned off Precedex  earlier this morning. DT's resolving, managing with prn Librium .  He initially had some confusion and sundowning after transferred out of ICU. But condition since has improved. No more confusion, will discharge today  Assessment and Plan: Acute Metabolic Encephalopathy associated with DTs Severe Alcohol  Withdrawal with Delirium Tremens Acute hypoxemia respiratory failure secondary to severe encephalopathy Aspiration pneumonia secondary to group B strep. Patient was initially intubated and managed in the ICU.  This is secondary to severe encephalopathy from delirium tremens.  Patient continues to have some confusion after transfer out of ICU. Initial CT scan was negative for acute changes, repeat CT scan today still negative. Condition finally improved after 2 nights of good sleep.  Currently patient no longer has any confusion.  Medically stable for discharge. Patient also had aspiration at the time of admission, sputum culture has group B strep, patient has been treated with penicillin .  Will continue 2 days dose.   Hypokalemia Potassium has  normalized.   Anemia Appears to be mild.   HTN (hypertension) Restarted nebivolol    Ambulatory dysfunction Discussed with daughter, does not want home physical therapy.         Consultants: ICU Procedures performed: Mechanical ventilation  Disposition: Home Diet recommendation:  Discharge Diet Orders (From admission, onward)     Start     Ordered   01/28/24 0000  Diet - low sodium heart healthy        01/28/24 1449           Cardiac diet DISCHARGE MEDICATION: Allergies as of 01/28/2024       Reactions   Chlorothiazide Other (See Comments)   dysuria   Elemental Sulfur Other (See Comments)   Unknown reaction   Buspirone Other (See Comments)   Ankle swelling   Doxazosin Other (See Comments)   Aggression   Hydralazine Other (See Comments)   tachycardia   Losartan Other (See Comments)   tremor   Misc. Sulfonamide Containing Compounds Other (See Comments)   Sulfa Antibiotics Other (See Comments)        Medication List     TAKE these medications    BYSTOLIC  PO Take 20 mg by mouth daily.   penicillin  v potassium 500 MG tablet Commonly known as: VEETID Take 1 tablet (500 mg total) by mouth 4 (four) times daily -  before meals and at bedtime for 2 days.   tadalafil 20 MG tablet Commonly known as: CIALIS Take 20 mg by mouth daily as needed.        Follow-up Information     Rudolpho Norleen JONETTA, MD Follow up in 1 week(s).   Specialty: Internal Medicine Contact information: [address] [facility] [place] [address] [phone]                Discharge Exam: Filed Weights   01/25/24 0331 01/26/24 0434 01/27/24 0500  Weight: 74.1 kg 77.3 kg 75.1 kg   General exam: Appears calm and comfortable  Respiratory system: Clear to auscultation. Respiratory effort normal. Cardiovascular system: S1 & S2 heard, RRR. No JVD, murmurs, rubs, gallops or clicks. No pedal edema. Gastrointestinal system: Abdomen is nondistended, soft and  nontender. No organomegaly or masses felt. Normal bowel sounds heard. Central nervous system: Alert and oriented. No focal neurological deficits. Extremities: Symmetric 5 x 5 power. Skin: No rashes, lesions or ulcers Psychiatry: Judgement and insight appear normal. Mood & affect appropriate.    Condition at discharge: good  The results of significant diagnostics from this hospitalization (including imaging, microbiology, ancillary and laboratory) are listed below for reference.   Imaging Studies: CT HEAD WO CONTRAST ( ) Result Date: 01/27/2024 EXAM: CT HEAD WITHOUT CONTRAST 01/27/2024 10:58:43 AM TECHNIQUE: CT of the head was performed without the administration of intravenous contrast. Automated exposure control, iterative reconstruction, and/or weight based adjustment of the mA/kV was utilized to reduce the radiation dose to as low as reasonably achievable. COMPARISON: 01/22/2024 CLINICAL HISTORY: Delirium. Delirium, confusion. FINDINGS: BRAIN AND VENTRICLES: No acute hemorrhage. Gray-white differentiation is preserved. No hydrocephalus. No extra-axial collection. No mass effect or midline shift. ORBITS: No acute abnormality. SINUSES: No acute abnormality. SOFT TISSUES AND SKULL: No acute soft tissue abnormality. No skull fracture. IMPRESSION: 1. No acute intracranial abnormality. Electronically signed by: Franky Stanford MD 01/27/2024 11:54 AM EDT RP Workstation: [id number]   DG Chest Port 1 View Result Date: 01/23/2024 CLINICAL DATA:  Acute respiratory failure, hypoxia EXAM: PORTABLE CHEST 1 VIEW COMPARISON:  01/22/2024 FINDINGS: Single frontal view of the chest demonstrates endotracheal tube overlying tracheal air column, tip 4 cm above carina. Enteric catheter passes below diaphragm, tip projecting over the gastric fundus. Cardiac silhouette is stable. There are low lung volumes, with patchy basilar consolidation left greater than right favor atelectasis over airspace disease. No effusion or  pneumothorax. No acute bony abnormalities. IMPRESSION: 1. Support devices as above. 2. Low lung volumes, with patchy bibasilar consolidation favoring atelectasis over airspace disease. Electronically Signed   By: Ozell Daring M.D.   On: 01/23/2024 08:50   DG Chest Portable 1 View Result Date: 01/22/2024 CLINICAL DATA:  63 year old male intubated. Alcohol  withdrawal, hallucination. EXAM: PORTABLE CHEST 1 VIEW COMPARISON:  Chest radiographs 08/15/2011. FINDINGS: Portable AP supine views at 0529 hours. Enteric tube placed into the stomach, tip at the gastric cardia. Endotracheal tube tip in good position between the clavicles and carina. Lower lung volumes. Mildly accentuated cardiac and mediastinal contours. Allowing for portable technique the lungs are clear. No acute osseous abnormality identified. Nonobstructed visible bowel gas pattern. IMPRESSION: 1. Satisfactory ET tube and enteric tube placement. 2. Lower lung volumes. No acute cardiopulmonary abnormality. Electronically Signed   By: VEAR Hurst M.D.   On: 01/22/2024 06:42   CT HEAD WO CONTRAST ( ) Result Date: 01/22/2024 CLINICAL DATA:  Initial evaluation for acute altered mental status. EXAM: CT HEAD WITHOUT CONTRAST TECHNIQUE: Contiguous axial images were obtained from the base of the skull through the vertex without intravenous contrast. RADIATION DOSE REDUCTION: This exam was performed according to the departmental dose-optimization program which includes automated exposure control, adjustment of the mA and/or kV according to patient size and/or use of iterative reconstruction technique. COMPARISON:  CT from 11/24/2018. FINDINGS: Brain:  Cerebral volume within normal limits. Mild chronic microvascular ischemic disease. No acute intracranial hemorrhage. No acute large vessel territory infarct. No mass lesion or midline shift. No hydrocephalus or extra-axial fluid collection. Vascular: No abnormal hyperdense vessel. Calcified atherosclerosis present at  skull base with no made of a focal calcified plaque in the right M1 segment. Skull: Scalp soft tissues within normal limits.  Calvarium intact. Sinuses/Orbits: Left gaze noted. Globes orbital soft tissues otherwise unremarkable. Mild scattered mucosal thickening present about the ethmoidal air cells. Small right sphenoid sinus retention cyst. Paranasal sinuses are otherwise clear. No mastoid effusion. Other: None. IMPRESSION: 1. No acute intracranial abnormality. 2. Mild chronic microvascular ischemic disease. Electronically Signed   By: Morene Hoard M.D.   On: 01/22/2024 03:35    Microbiology: Results for orders placed or performed during the hospital encounter of 01/22/24  MRSA Next Gen by PCR, Nasal     Status: None   Collection Time: 01/22/24  8:31 AM   Specimen: Nasal Mucosa; Nasal Swab  Result Value Ref Range Status   MRSA by PCR Next Gen NOT DETECTED NOT DETECTED Final    Comment: (NOTE) The GeneXpert MRSA Assay (FDA approved for NASAL specimens only), is one component of a comprehensive MRSA colonization surveillance program. It is not intended to diagnose MRSA infection nor to guide or monitor treatment for MRSA infections. Test performance is not FDA approved in patients less than 7 years old. Performed at [facility], [address] [place], [place] [address]   Culture, Respiratory w Gram Stain     Status: None   Collection Time: 01/23/24 11:47 AM   Specimen: Tracheal Aspirate; Respiratory  Result Value Ref Range Status   Specimen Description   Final    TRACHEAL ASPIRATE Performed at [facility], [address] [place], [place] [address]    Special Requests   Final    NONE Performed at [facility], [address] [place], [place] [address]    Gram Stain   Final    ABUNDANT WBC PRESENT, PREDOMINANTLY PMN FEW SQUAMOUS EPITHELIAL CELLS PRESENT MODERATE GRAM POSITIVE COCCI RARE GRAM NEGATIVE RODS    Culture   Final    FEW GROUP  B STREP(S.AGALACTIAE)ISOLATED TESTING AGAINST S. AGALACTIAE NOT ROUTINELY PERFORMED DUE TO PREDICTABILITY OF AMP/PEN/VAN SUSCEPTIBILITY. Performed at [facility], [address], [place], [place] [address]    Report Status 01/25/2024 FINAL  Final    Labs: CBC: Recent Labs  Lab 01/22/24 0101 01/22/24 0831 01/23/24 0625 01/24/24 0422 01/25/24 0248 01/26/24 1035 01/27/24 0919  WBC 6.1  --  9.5 6.1 5.9 5.5 4.1  NEUTROABS 3.6  --   --   --   --   --   --   HGB 12.2*   < > 12.0* 12.6* 12.5* 12.0* 11.8*  HCT 34.8*  --  34.8* 36.7* 36.0* 33.7* 35.0*  MCV 102.7*  --  105.1* 103.4* 102.0* 100.6* 104.2*  PLT 71*  --  83* 89* 125* 204 270   < > = values in this interval not displayed.   Basic Metabolic Panel: Recent Labs  Lab 01/23/24 0625 01/23/24 1152 01/24/24 0422 01/25/24 0248 01/26/24 1035 01/27/24 0919 01/28/24 0439 01/28/24 1359  NA 136  --  136 139 140 139 141  --   K 3.2*   < > 3.5 3.4* 3.5 3.1* 3.2* 4.6  CL 103  --  106 103 104 104 106  --   CO2 22  --  26 26 30 24 28   --   GLUCOSE 120*  --  125* 96 113* 133* 98  --   BUN 6*  --  7* 7* 9 8 8   --   CREATININE 0.87  --  0.51* 0.67 0.66 0.82 0.61  --   CALCIUM 7.7*  --  7.9* 8.4* 8.6* 8.5* 8.9  --   MG 2.0  --  2.0 1.9 2.1  --  2.1  --   PHOS 3.6  --  2.6 2.4* 2.9 3.6 4.2  --    < > = values in this interval not displayed.   Liver Function Tests: Recent Labs  Lab 01/22/24 0101 01/23/24 0625 01/24/24 0422 01/25/24 0248 01/26/24 1035 01/27/24 0919  AST 53*  --   --   --   --  43*  ALT 39  --   --   --   --  32  ALKPHOS 41  --   --   --   --  36*  BILITOT 0.9  --   --   --   --  0.2  PROT 6.3*  --   --   --   --  5.9*  ALBUMIN 3.5 2.7* 2.7* 3.0* 3.3* 3.0*  3.1*   CBG: Recent Labs  Lab 01/26/24 2030 01/27/24 0013 01/27/24 0353 01/27/24 0823 01/27/24 1154  GLUCAP 159* 138* 126* 119* 105*    Discharge time spent: 35 minutes.  Signed: Murvin Mana, MD [facility] 01/28/2024

## 2024-01-28 NOTE — Progress Notes (Signed)
 Occupational Therapy Treatment Patient Details Name: Ryan Rivers MRN: [id number] DOB: [birth year] Today's Date: 01/28/2024   History of present illness Pt is a 63 y.o. male admitted for acute alcohol  withdrawal delirium (pt with encephalopathy, hallucinations, and confusion); [facility]  Intubated 01/22/24 and extubated 01/24/24.  PMH includes htn, alcohol  use disorder (up to 12 drinks daily), anxiety, insomnia.   OT comments  Upon entering the room, pt's supportive family present and pt is cooperative and pleasant during session. Pt performs toileting with intermittent supervision. Grooming tasks at sink in standing without assistance and then pt sits on bed to don B shoes without assistance. Pt ambulating 1000' in hallway and also up and down 1 flight of stairs without use of AD with supervision. Pt needing cues to take note of room number and then verbal guidance cues needed to locate room at end of session. Pt returning to bed at end of session. Call bell and all needed items within reach. Pt demonstrating increased safety awareness but still needing cues for cognitive deficits to be addressed.       If plan is discharge home, recommend the following:  Assist for transportation;Assistance with cooking/housework;Supervision due to cognitive status   Equipment Recommendations  None recommended by OT       Precautions / Restrictions Precautions Precautions: Fall Recall of Precautions/Restrictions: Impaired Precaution/Restrictions Comments: CIWA       Mobility Bed Mobility Overal bed mobility: Independent                  Transfers Overall transfer level: Needs assistance Equipment used: None Transfers: Sit to/from Stand Sit to Stand: Supervision                 Balance Overall balance assessment: Needs assistance Sitting-balance support: Feet supported Sitting balance-Leahy Scale: Normal     Standing balance support: No  upper extremity supported Standing balance-Leahy Scale: Good                             ADL either performed or assessed with clinical judgement   ADL                                         General ADL Comments: supervision overall     Vision Patient Visual Report: No change from baseline           Communication Communication Communication: No apparent difficulties   Cognition Arousal: Alert Behavior During Therapy: WFL for tasks assessed/performed Cognition: Cognition impaired           Executive functioning impairment (select all impairments): Sequencing, Reasoning, Problem solving                   Following commands: Impaired Following commands impaired: Follows multi-step commands with increased time, Follows multi-step commands inconsistently      Cueing   Cueing Techniques: Verbal cues             Pertinent Vitals/ Pain       Pain Assessment Pain Assessment: No/denies pain         Frequency  Min 2X/week        Progress Toward Goals  OT Goals(current goals can now be found in the care plan section)  Progress towards OT goals: Progressing toward goals  AM-PAC OT 6 Clicks Daily Activity     Outcome Measure   Help from another person eating meals?: None Help from another person taking care of personal grooming?: None Help from another person toileting, which includes using toliet, bedpan, or urinal?: None Help from another person bathing (including washing, rinsing, drying)?: None Help from another person to put on and taking off regular upper body clothing?: None Help from another person to put on and taking off regular lower body clothing?: None 6 Click Score: 24    End of Session    OT Visit Diagnosis: Unsteadiness on feet (R26.81);Other abnormalities of gait and mobility (R26.89);Muscle weakness (generalized) (M62.81)   Activity Tolerance Patient tolerated treatment well   Patient Left  with family/visitor present;in bed;with nursing/sitter in room   Nurse Communication Mobility status        Time: 9063-9049 OT Time Calculation (min): 14 min  Charges: OT General Charges $OT Visit: 1 Visit OT Treatments $Self Care/Home Management : 8-22 mins  Izetta Claude, MS, OTR/L , CBIS ascom [phone]  01/28/24, 10:22 AM

## 2024-02-03 NOTE — Progress Notes (Signed)
 Chief Complaint:   Chief Complaint  Patient presents with  . Follow-up    Subjective:   Ryan Rivers is a 63 y.o. male in today for hospital follow-up.  He was recently held hospitalized with alcohol  withdrawal and DTs.  He says he now has quit drinking and is in AA for recovery.  Current Outpatient Medications  Medication Sig Dispense Refill  . nebivoloL  (BYSTOLIC ) 20 mg tablet Take 1 tablet (20 mg total) by mouth once daily 30 tablet 6  . QUEtiapine  (SEROQUEL ) 25 MG tablet Take 25 mg by mouth     No current facility-administered medications for this visit.    Allergies as of 02/03/2024 - Reviewed 02/03/2024  Allergen Reaction Noted  . Diuril [chlorothiazide] Other (See Comments) 08/26/2011  . Sulfur (not sulfa) Unknown 08/26/2011  . Buspirone (bulk) Other (See Comments) 08/26/2011  . Doxazosin Other (See Comments) 11/02/2013  . Hydralazine Other (See Comments) 08/26/2011  . Lisinopril Diarrhea 05/19/2012  . Losartan Other (See Comments) 08/26/2011  . Sulfa (sulfonamide antibiotics) Unknown 11/02/2013    Past Medical History:  Diagnosis Date  . Abnormal LFTs 2014   Had liver USS at at that time and Hepatitis testing  . Anxiety   . Colon polyps 04/2009  . ED (erectile dysfunction)   . Hx of prostatitis   . Hyperlipidemia   . Hypertension   . Insomnia ED  . Microalbuminuria 09/2011   urine MA 48 09/2011    Past Surgical History:  Procedure Laterality Date  . COLONOSCOPY  02/16/2019   Hyperplastic colon polyp/PHx CP/Repeat 21yrs/[facility]  . COLONOSCOPY  08/23/2013, 03/20/2010   Dr. SHAUNNA. Oh @ [facility] - PHPolyps, rpt 5 yrs  . HEMORRHOIDECTOMY INTERNAL & EXTERNAL     Dr Claudene     Family History  Problem Relation Name Age of Onset  . High blood pressure (Hypertension) Father Darrell   . Coronary Artery Disease (Blocked arteries around heart) Father Darrell 91  . High blood pressure (Hypertension) Paternal Jeannine Bucks   . Glaucoma Neg Hx    . Macular  degeneration Neg Hx      Social History:  reports that he has never smoked. He has never been exposed to tobacco smoke. He has never used smokeless tobacco. He reports current alcohol  use of about 8.0 standard drinks of alcohol  per week. He reports that he does not use drugs.  Results for orders placed or performed in visit on 02/26/23  Comprehensive Metabolic Panel (CMP)  Result Value Ref Range   Glucose 106 70 - 110 mg/dL   Sodium 860 863 - 854 mmol/L   Potassium 4.7 3.6 - 5.1 mmol/L   Chloride 100 97 - 109 mmol/L   Carbon Dioxide (CO2) 28.7 22.0 - 32.0 mmol/L   Urea Nitrogen (BUN) 7 7 - 25 mg/dL   Creatinine 0.8 0.7 - 1.3 mg/dL   Glomerular Filtration Rate (eGFR) 99 >60 mL/min/1.73sq m   Calcium 9.2 8.7 - 10.3 mg/dL   AST  18 8 - 39 U/L   ALT  10 6 - 57 U/L   Alk Phos (alkaline Phosphatase) 57 34 - 104 U/L   Albumin 4.4 3.5 - 4.8 g/dL   Bilirubin, Total 0.8 0.3 - 1.2 mg/dL   Protein, Total 6.7 6.1 - 7.9 g/dL   A/G Ratio 1.9 1.0 - 5.0 gm/dL  CBC w/auto Differential (5 Part)  Result Value Ref Range   WBC (White Blood Cell Count) 9.1 4.1 - 10.2 10^3/uL   RBC (Red Blood Cell  Count) 4.16 (L) 4.69 - 6.13 10^6/uL   Hemoglobin 14.9 14.1 - 18.1 gm/dL   Hematocrit 57.4 59.9 - 52.0 %   MCV (Mean Corpuscular Volume) 102.2 (H) 80.0 - 100.0 fl   MCH (Mean Corpuscular Hemoglobin) 35.8 (H) 27.0 - 31.2 pg   MCHC (Mean Corpuscular Hemoglobin Concentration) 35.1 32.0 - 36.0 gm/dL   Platelet Count 767 849 - 450 10^3/uL   RDW-CV (Red Cell Distribution Width) 11.6 11.6 - 14.8 %   MPV (Mean Platelet Volume) 9.2 (L) 9.4 - 12.4 fl   Neutrophils 6.06 1.50 - 7.80 10^3/uL   Lymphocytes 1.82 1.00 - 3.60 10^3/uL   Monocytes 1.02 0.00 - 1.50 10^3/uL   Eosinophils 0.15 0.00 - 0.55 10^3/uL   Basophils 0.04 0.00 - 0.09 10^3/uL   Neutrophil % 66.5 32.0 - 70.0 %   Lymphocyte % 20.0 10.0 - 50.0 %   Monocyte % 11.2 4.0 - 13.0 %   Eosinophil % 1.6 1.0 - 5.0 %   Basophil% 0.4 0.0 - 2.0 %   Immature  Granulocyte % 0.3 <=0.7 %   Immature Granulocyte Count 0.03 <=0.06 10^3/L  Lipid Panel w/calc LDL  Result Value Ref Range   Cholesterol, Total 194 100 - 200 mg/dL   Triglyceride 64 35 - 199 mg/dL   HDL (High Density Lipoprotein) Cholesterol 86.7 (H) 29.0 - 71.0 mg/dL   LDL Calculated 95 0 - 130 mg/dL   VLDL Cholesterol 13 mg/dL   Cholesterol/HDL Ratio 2.2   Urinalysis w/Microscopic  Result Value Ref Range   Color Light Yellow Colorless, Straw, Light Yellow, Yellow, Dark Yellow   Clarity Clear Clear   Specific Gravity 1.013 1.005 - 1.030   pH, Urine 6.0 5.0 - 8.0   Protein, Urinalysis Negative Negative mg/dL   Glucose, Urinalysis Negative Negative mg/dL   Ketones, Urinalysis Negative Negative mg/dL   Blood, Urinalysis Negative Negative   Nitrite, Urinalysis Negative Negative   Leukocyte Esterase, Urinalysis Negative Negative   Bilirubin, Urinalysis Negative Negative   Urobilinogen, Urinalysis 0.2 0.2 - 1.0 mg/dL   WBC, UA <1 <=5 /hpf   Red Blood Cells, Urinalysis 2 <=3 /hpf   Bacteria, Urinalysis 0-5 0 - 5 /hpf   Squamous Epithelial Cells, Urinalysis 0 /hpf  PSA, Total (Screen)  Result Value Ref Range   PSA (Prostate Specific Antigen), Total 1.19 0.10 - 4.00 ng/mL   Narrative   Test results were determined with Beckman Coulter Hybritech Assay. Values obtained with different assay methods cannot be used interchangeably in serial testing. Assay results should not be interpreted as absolute evidence of the presence or absence of malignant disease  Hemoglobin A1C  Result Value Ref Range   Hemoglobin A1C 5.5 4.2 - 5.6 %   Average Blood Glucose (Calc) 111 mg/dL   Narrative   Normal Range:    4.2 - 5.6% Increased Risk:  5.7 - 6.4% Diabetes:        >= 6.5% Glycemic Control for adults with diabetes:  <7%        ROS:  General: No fever, chills or recent illness. No change in weight Skin:   No skin lesions, growths, masses, rashes, pruritus  HEENT: No change in vision or  hearing. No pain or difficulty with swallowing Respiratory: No cough or shortness of breath CV:  No chest pain or palpitations GI:  No pain, dyspepsia or change in bowel habits GU:  No dysuria, frequency, or hesitancy MSK:  No joint pain or injury Neurological: No headaches, changes in mental status,  loss of sensation or strength Endocrine:  No heat or cold intolerance, polydipsia, polyuria  Objective:   Body mass index is 24.24 kg/m.  BP 130/80   Pulse 71   Ht 172.7 cm (5' 8)   Wt 72.3 kg (159 lb 6.4 oz)   SpO2 99%   BMI 24.24 kg/m   General: WD/WN male, in no acute distress    Assessment/Plan:   Hospital discharge follow-up  (primary encounter diagnosis) Alcohol  abuse  Assessment and Plan  1.  Hospital follow-up.  Reviewed hospital documentation, lab results and imaging studies.  Reviewed discharge medications and reconciled them. 2.  Alcohol  abuse.  He is now in [place] for recovery.    Goals     . * Maintain health/healthy lifestyle (pt-stated)    . * Maintain health/healthy lifestyle (pt-stated)    . * Maintain health/healthy lifestyle (pt-stated)    . * Maintain health/healthy lifestyle (pt-stated)       Goals       Patient Stated   . * Maintain health/healthy lifestyle (pt-stated)    . * Maintain health/healthy lifestyle (pt-stated)    . * Maintain health/healthy lifestyle (pt-stated)    . * Patient Goals (pt-stated)      Patient declined    . * Reduce Stress/Anxiety (pt-stated)           . * Patient Goals (pt-stated)      Patient declined    . * Reduce Stress/Anxiety (pt-stated)        NORLEEN ALM ROWER, MD  Portions of this note were created using dictation software and may contain typographical errors.  *Some images could not be shown.

## 2024-02-08 NOTE — Progress Notes (Signed)
 History of Present Illness:   Ryan Rivers is a 63 y.o. male here for concern for numbness and tenderness at the top of his intergluteal cleft.  He was recently admitted to the hospital for delirium tremens/alcohol  withdrawal.  He was intubated for the first 3 days.  He was discharged on August 21.  He was admitted on the 15th.  He saw his PCP for follow-up on August 27.  He was concerned about the numbness and possible infection.  He presents today with his mother, who is helping to provide history.  She states that they have been applying some sort of colloidal silver application to the area.  Minimal research on the Internet, and are concerned about MRSA.  He has no known history of MRSA.  He was put on antibiotics while in the hospital, for some sort of rash.  He denies any difficulty with bowel habits.  He denies any hematochezia or melena.  He denies any fever or chills.   Past Medical History:   Past Medical History:  Diagnosis Date  . Abnormal LFTs 2014   Had liver USS at at that time and Hepatitis testing  . Anxiety   . Colon polyps 04/2009  . ED (erectile dysfunction)   . Hx of prostatitis   . Hyperlipidemia   . Hypertension   . Insomnia ED  . Microalbuminuria 09/2011   urine MA 48 09/2011    Past Surgical History:   Past Surgical History:  Procedure Laterality Date  . COLONOSCOPY  02/16/2019   Hyperplastic colon polyp/PHx CP/Repeat 79yrs/[facility]  . COLONOSCOPY  08/23/2013, 03/20/2010   Dr. SHAUNNA. Oh @ [facility] - PHPolyps, rpt 5 yrs  . HEMORRHOIDECTOMY INTERNAL & EXTERNAL     Dr Claudene    Allergies:   Allergies  Allergen Reactions  . Diuril [Chlorothiazide] Other (See Comments)    dysuria  . Sulfur (Not Sulfa) Unknown    Unknown reaction  . Buspirone (Bulk) Other (See Comments)    Ankle swelling  . Doxazosin Other (See Comments)    Aggression  . Hydralazine Other (See Comments)    tachycardia  . Lisinopril Diarrhea  . Losartan Other (See Comments)    tremor   . Sulfa (Sulfonamide Antibiotics) Unknown    Current Medications:   Prior to Admission medications  Medication Sig Taking? Last Dose  nebivoloL  (BYSTOLIC ) 20 mg tablet Take 1 tablet (20 mg total) by mouth once daily Yes Taking  QUEtiapine  (SEROQUEL ) 25 MG tablet Take 25 mg by mouth Yes Taking  doxycycline (VIBRAMYCIN) 100 MG capsule Take 1 capsule (100 mg total) by mouth 2 (two) times daily for 7 days      Family History:   Family History  Problem Relation Name Age of Onset  . High blood pressure (Hypertension) Father Darrell   . Coronary Artery Disease (Blocked arteries around heart) Father Darrell 95  . High blood pressure (Hypertension) Paternal Jeannine Bucks   . Glaucoma Neg Hx    . Macular degeneration Neg Hx      Social History:   Social History   Socioeconomic History  . Marital status: Divorced  . Number of children: 2  Occupational History  . 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 Smoking status: Never    Passive exposure: Never  . Smokeless tobacco: Never  Vaping Use  . Vaping status: Never Used  Substance and Sexual Activity  . Alcohol  use: Yes    Alcohol /week: 8.0 standard drinks of alcohol   Types: 8 Cans of beer per week    Comment: moderate  . Drug use: No  . Sexual activity: Not Currently    Partners: Female    Birth control/protection: None  Social History Narrative   Married, has a daughter and son   Runs a machine shop   Tobacco none   Etoh 1-2 beers daily   Hobbies- working fishing   Social Drivers of Health   Food Insecurity: No Food Insecurity (01/22/2024)   Received from [facility]   Hunger Vital Sign   . Within the past 12 months, you worried that your food would run out before you got the money to buy more.: Never true   . Within the past 12 months, the food you bought just didn't last and you didn't have money to get more.: Never true  Transportation Needs: No Transportation Needs (01/22/2024)   Received from  [facility] - [facility]   . In the past 12 months, has lack of transportation kept you from medical appointments or from getting medications?: No   . In the past 12 months, has lack of transportation kept you from meetings, work, or from getting things needed for daily living?: No  Social Connections: Socially Isolated (01/22/2024)   Received from [facility]   Social Connection and Isolation Panel   . In a typical week, how many times do you talk on the phone with family, friends, or neighbors?: Once a week   . How often do you get together with friends or relatives?: Once a week   . How often do you attend church or religious services?: 1 to 4 times per year   . Do you belong to any clubs or organizations such as church groups, unions, fraternal or athletic groups, or school groups?: No   . How often do you attend meetings of the clubs or organizations you belong to?: Never   . Are you married, widowed, divorced, separated, never married, or living with a partner?: Separated  Housing Stability: Unknown (01/18/2024)   Housing Stability Vital Sign   . Homeless in the Last Year: No    Review of Systems:   Review of Systems  Constitutional:  Negative for chills, fatigue, fever and unexpected weight change.  HENT:  Negative for ear pain, rhinorrhea and sore throat.   Eyes:  Negative for pain and visual disturbance.  Respiratory:  Negative for cough, chest tightness and shortness of breath.   Cardiovascular:  Negative for chest pain, palpitations and leg swelling.  Gastrointestinal:  Negative for abdominal pain, constipation and diarrhea.  Genitourinary:  Negative for frequency and hematuria.  Musculoskeletal:  Negative for arthralgias, joint swelling and myalgias.  Skin:  Positive for wound. Negative for rash.  Neurological:  Negative for headaches.    Vitals:   Vitals:   02/08/24 0913  BP: (!) 157/84  Pulse: 74  Temp: 36.7 C (98.1 F)  TempSrc: Oral  SpO2: 94%   Weight: 73.4 kg (161 lb 12.8 oz)  Height: 172.7 cm (5' 8)     Body mass index is 24.6 kg/m.  Physical Exam:   Physical Exam Vitals and nursing note reviewed.  Constitutional:      Appearance: He is well-developed.  HENT:     Head: Normocephalic and atraumatic.     Right Ear: Hearing, tympanic membrane, ear canal and external ear normal.     Left Ear: Hearing, tympanic membrane, ear canal and external ear normal.     Nose:  Nose normal.     Mouth/Throat:     Pharynx: Oropharynx is clear. Uvula midline.  Eyes:     General: Lids are normal. Vision grossly intact.     Conjunctiva/sclera: Conjunctivae normal.     Pupils: Pupils are equal, round, and reactive to light.  Cardiovascular:     Rate and Rhythm: Normal rate and regular rhythm.     Pulses: Normal pulses.     Heart sounds: Normal heart sounds. No murmur heard. Pulmonary:     Effort: Pulmonary effort is normal.     Breath sounds: Normal breath sounds. No wheezing.  Abdominal:     General: Bowel sounds are normal.     Palpations: Abdomen is soft.     Tenderness: There is no abdominal tenderness.     Comments: Gastrointestinal system examined as above.  Genitourinary:  Musculoskeletal:        General: Normal range of motion.     Cervical back: Full passive range of motion without pain, normal range of motion and neck supple.  Skin:    General: Skin is warm and dry.     Findings: Wound present.     Comments: Stage 2/3 pressure ulcer at the superior aspect of the intergluteal cleft; no surrounding erythema, no drainage, no bleeding. Tender to palpation.  Neurological:     General: No focal deficit present.     Mental Status: He is alert and oriented to person, place, and time.  Psychiatric:        Attention and Perception: Attention and perception normal.        Mood and Affect: Mood and affect normal.        Speech: Speech normal.        Behavior: Behavior normal. Behavior is cooperative.        Thought Content:  Thought content normal.     Assessment and Plan:  No results found for this visit on 02/08/24.  Diagnoses and all orders for this visit:  Pressure injury of sacral region, stage 2 (CMS/HHS-HCC)  Other orders -     doxycycline (VIBRAMYCIN) 100 MG capsule; Take 1 capsule (100 mg total) by mouth 2 (two) times daily for 7 days    Patient Instructions  As we discussed, this is an ulcer (bedsore), which is skin breakdown secondary to your recent hospitalization.  This will heal over time.  This is a loss of the surface skin.  Your numbness will return as the skin heals.  For now, change the dressing at least once daily, twice if you can.  For the first few days, use the yellow gauze that we have given you OR the colloidal preparation that you already have.  I have also prescribed an antibiotic.  These wounds require weekly monitoring to ensure proper healing, and no development of complications.  Recommend follow-up with your primary care provider, for reevaluation.  Please inform them that you have this bedsore, so if they have further recommendations, they may instruct you further.  For the first few days, try to change the dressing, including the Xeroform or colloidal product, at least twice daily.  Especially when you are in warm environments or on your feet a lot working.  Keep the wound clean with soap and water , with your daily shower.  No need to clean more often than that.  Do try to take care of when having bowel movements, and try to avoid contaminating that area with fecal matter.  Try to keep covered if you  can.  Return to care should you have any worsening of symptoms such as increased pain, worsening numbness, blood in your bowel movements, drainage from the sore, fever, chills, etc.  Portions of this note were created using dictation software and may contain typographical errors.   Patient received an After Visit Summary

## 2024-05-13 ENCOUNTER — Other Ambulatory Visit (HOSPITAL_BASED_OUTPATIENT_CLINIC_OR_DEPARTMENT_OTHER): Payer: Self-pay

## 2024-06-16 ENCOUNTER — Other Ambulatory Visit (HOSPITAL_COMMUNITY): Payer: Self-pay

## 2024-08-12 ENCOUNTER — Encounter
